# Patient Record
Sex: Male | Born: 1950
Health system: Southern US, Community
[De-identification: ages and names within clinical notes are randomized; demographics above are authoritative.]

## PROBLEM LIST (undated history)

## (undated) DIAGNOSIS — E785 Hyperlipidemia, unspecified: Secondary | ICD-10-CM

## (undated) HISTORY — PX: HEMORRHOID SURGERY: SHX153

## (undated) HISTORY — DX: Hyperlipidemia, unspecified: E78.5

---

## 2012-02-05 ENCOUNTER — Ambulatory Visit (INDEPENDENT_AMBULATORY_CARE_PROVIDER_SITE_OTHER): Payer: BC Managed Care – PPO | Admitting: Sports Medicine

## 2012-02-05 ENCOUNTER — Encounter: Payer: Self-pay | Admitting: Sports Medicine

## 2012-02-05 VITALS — BP 118/81 | HR 74 | Wt 211.0 lb

## 2012-02-05 DIAGNOSIS — E785 Hyperlipidemia, unspecified: Secondary | ICD-10-CM

## 2012-02-05 DIAGNOSIS — M25512 Pain in left shoulder: Secondary | ICD-10-CM | POA: Insufficient documentation

## 2012-02-05 DIAGNOSIS — Z299 Encounter for prophylactic measures, unspecified: Secondary | ICD-10-CM

## 2012-02-05 DIAGNOSIS — Z0000 Encounter for general adult medical examination without abnormal findings: Secondary | ICD-10-CM | POA: Insufficient documentation

## 2012-02-05 DIAGNOSIS — M25519 Pain in unspecified shoulder: Secondary | ICD-10-CM

## 2012-02-05 DIAGNOSIS — M24873 Other specific joint derangements of unspecified ankle, not elsewhere classified: Secondary | ICD-10-CM

## 2012-02-05 DIAGNOSIS — M25371 Other instability, right ankle: Secondary | ICD-10-CM | POA: Insufficient documentation

## 2012-02-05 MED ORDER — MELOXICAM 15 MG PO TABS: ORAL_TABLET | ORAL | Status: DC

## 2012-02-05 NOTE — Progress Notes (Signed)
Subjective:    CC: Establish care.   HPI:  Left shoulder pain: Present for one year, localized on the anterior and lateral mid humerus. Worse with abduction and external rotation, not so bad with reaching overhead. He does wake him from sleep. He has not tried any specific type of treatment. He has not had this evaluated yet. The pain is localized, does not radiate, and is dull.  Right ankle weakness: Notes the he tends to roll the ankle often while playing golf. He thinks the week. He does not have any pain. Denies any numbness or tingling, or pain in his back.  Preventive care: Needs a flu shot, would like cholesterol checked, thinks he is up-to-date on his Tdap.  Past medical history, Surgical history, Family history, Social history, Allergies, and medications have been entered into the medical record, reviewed, and no changes needed.   Review of Systems: No headache, visual changes, nausea, vomiting, diarrhea, constipation, dizziness, abdominal pain, skin rash, fevers, chills, night sweats, swollen lymph nodes, weight loss, chest pain, body aches, joint swelling, muscle aches, or shortness of breath.   Objective:    General: Well Developed, well nourished, and in no acute distress.  Neuro: Alert and oriented x3, extra-ocular muscles intact.  HEENT: Normocephalic, atraumatic, pupils equal round reactive to light, neck supple, no masses, no lymphadenopathy, thyroid nonpalpable.  Skin: Warm and dry, no rashes noted.  Cardiac: Regular rate and rhythm, no murmurs rubs or gallops.  Respiratory: Clear to auscultation bilaterally. Not using accessory muscles, speaking in full sentences.  Abdominal: Soft, nontender, nondistended, positive bowel sounds, no masses, no organomegaly.  Left Shoulder: Inspection reveals no abnormalities, atrophy or asymmetry. Palpation is normal with no tenderness over AC joint or bicipital groove. ROM is full in all planes. Rotator cuff strength is weak to internal  rotation with a positive liftoff test. Positive Neer's, positive Hawkins, negative empty can sign. He does have some weakness with resisted abduction. Speeds and Yergason's tests normal. No labral pathology noted with negative Obrien's, negative clunk and good stability. Normal scapular function observed. No painful arc and no drop arm sign. No apprehension sign  Right Ankle: No visible erythema or swelling. Range of motion is full in all directions. Strength is 5/5 in all directions. Stable lateral and medial ligaments; squeeze test and kleiger test unremarkable; Talar dome nontender; No pain at base of 5th MT; No tenderness over cuboid; No tenderness over N spot or navicular prominence No tenderness on posterior aspects of lateral and medial malleolus No sign of peroneal tendon subluxations or tenderness to palpation Negative tarsal tunnel tinel's Able to walk 4 steps.  Impression and Recommendations:    The patient was counselled, risk factors were discussed, anticipatory guidance given.

## 2012-02-05 NOTE — Assessment & Plan Note (Signed)
Exam is unremarkable. Strength is excellent, and think his symptoms are mostly due to proprioceptive insufficiency. We'll have him work on some home rehabilitation exercises. He can see me back on an as needed basis for this.

## 2012-02-05 NOTE — Patient Instructions (Signed)
Exercise prescription:  You should adjust the intensity of your exercise based on your heart rate. The [employer] sports medicine recommends keeping your heart rate between 70-80% of its maximum for 30 minutes, 3-5 times per week. Maximum heart rate = (220 - age). Multiply this number by 0.75 to get your goal heart rate. If lower, then increase the intensity of your exercise. If the number is higher, you may decrease the intensity of your exercise.  Initial Hamstring Rehab Protocol Hamstring curls: Start with 3 sets of 15 (no weight); Progress by 5 reps every 3 days until you reach 3 sets of 30; After 3 days at 3 sets of 30, add 2lb ankle weight at 3 sets of 10; Increase every 5 days by 5 reps. You may add 2lbs ankle weight once weekly. Hamstring swings- swing leg backwards and curl at the end of the swing. Follow same schedule as above. Hamstring running lunges- running lunge position means no more than 45 degrees of knee flexion and running motion. Follow same schedule as above.

## 2012-02-05 NOTE — Assessment & Plan Note (Signed)
Symptoms are suggestive of rotator cuff impingement syndrome. Mobic, rehabilitation exercises, return to see me in 4-6 weeks. Injection if no better.

## 2012-02-05 NOTE — Assessment & Plan Note (Addendum)
Flu shot. Colonoscopy is up-to-date, and was done in 2013 We'll give Tdap at his complete physical appointment. CBC, CMET, lipids, testosterone. He will come back for blood work when he is fasting.

## 2012-02-09 LAB — COMPREHENSIVE METABOLIC PANEL WITH GFR
ALT: 26 U/L (ref 0–53)
Albumin: 4.3 g/dL (ref 3.5–5.2)
Alkaline Phosphatase: 58 U/L (ref 39–117)
Glucose, Bld: 94 mg/dL (ref 70–99)
Potassium: 4.3 meq/L (ref 3.5–5.3)
Sodium: 138 meq/L (ref 135–145)
Total Bilirubin: 0.4 mg/dL (ref 0.3–1.2)
Total Protein: 7 g/dL (ref 6.0–8.3)

## 2012-02-09 LAB — LIPID PANEL
Cholesterol: 240 mg/dL — ABNORMAL HIGH (ref 0–200)
HDL: 37 mg/dL — ABNORMAL LOW (ref >39)
LDL Cholesterol: 183 mg/dL — ABNORMAL HIGH (ref 0–99)
Total CHOL/HDL Ratio: 6.5 Ratio
Triglycerides: 99 mg/dL (ref <150)
VLDL: 20 mg/dL (ref 0–40)

## 2012-02-09 LAB — COMPREHENSIVE METABOLIC PANEL
AST: 23 U/L (ref 0–37)
BUN: 18 mg/dL (ref 6–23)
CO2: 24 mEq/L (ref 19–32)
Calcium: 9 mg/dL (ref 8.4–10.5)
Chloride: 105 mEq/L (ref 96–112)
Creat: 1.08 mg/dL (ref 0.50–1.35)

## 2012-02-09 LAB — CBC
HCT: 43.3 % (ref 39.0–52.0)
Hemoglobin: 15.1 g/dL (ref 13.0–17.0)
MCH: 34.3 pg — ABNORMAL HIGH (ref 26.0–34.0)
MCHC: 34.9 g/dL (ref 30.0–36.0)
MCV: 98.4 fL (ref 78.0–100.0)
Platelets: 320 K/uL (ref 150–400)
RBC: 4.4 MIL/uL (ref 4.22–5.81)
RDW: 14.9 % (ref 11.5–15.5)
WBC: 5.2 10*3/uL (ref 4.0–10.5)

## 2012-02-10 DIAGNOSIS — E785 Hyperlipidemia, unspecified: Secondary | ICD-10-CM | POA: Insufficient documentation

## 2012-02-10 MED ORDER — ASPIRIN EC 81 MG PO TBEC: 81.0000 mg | DELAYED_RELEASE_TABLET | Freq: Every day | ORAL | Status: DC

## 2012-02-10 MED ORDER — ATORVASTATIN CALCIUM 40 MG PO TABS: 40.0000 mg | ORAL_TABLET | Freq: Every day | ORAL | Status: DC

## 2012-02-10 NOTE — Addendum Note (Signed)
Addended by: Monica Becton on: 02/10/2012 10:32 AM   Modules accepted: Orders

## 2012-02-12 LAB — TESTOSTERONE, FREE, TOTAL, SHBG
Sex Hormone Binding: 32 nmol/L (ref 13–71)
Testosterone, Free: 89.7 pg/mL (ref 47.0–244.0)
Testosterone-% Free: 2.1 % (ref 1.6–2.9)
Testosterone: 429.58 ng/dL (ref 300–890)

## 2012-02-12 NOTE — Assessment & Plan Note (Signed)
Patient desires to start diet and exercise before proceeding to pharmacologic intervention. I think this is appropriate. We will do this for 6 weeks, and if no better will start Lipitor 40.

## 2012-03-04 ENCOUNTER — Encounter: Payer: Self-pay | Admitting: Sports Medicine

## 2012-03-04 ENCOUNTER — Ambulatory Visit (INDEPENDENT_AMBULATORY_CARE_PROVIDER_SITE_OTHER): Payer: BC Managed Care – PPO | Admitting: Sports Medicine

## 2012-03-04 VITALS — BP 102/63 | HR 82 | Ht 71.0 in | Wt 207.0 lb

## 2012-03-04 DIAGNOSIS — Z23 Encounter for immunization: Secondary | ICD-10-CM

## 2012-03-04 DIAGNOSIS — M25519 Pain in unspecified shoulder: Secondary | ICD-10-CM

## 2012-03-04 DIAGNOSIS — M25512 Pain in left shoulder: Secondary | ICD-10-CM

## 2012-03-04 DIAGNOSIS — M25371 Other instability, right ankle: Secondary | ICD-10-CM

## 2012-03-04 DIAGNOSIS — Z299 Encounter for prophylactic measures, unspecified: Secondary | ICD-10-CM

## 2012-03-04 DIAGNOSIS — E785 Hyperlipidemia, unspecified: Secondary | ICD-10-CM

## 2012-03-04 DIAGNOSIS — M24873 Other specific joint derangements of unspecified ankle, not elsewhere classified: Secondary | ICD-10-CM

## 2012-03-04 NOTE — Assessment & Plan Note (Addendum)
Up-to-date on Tdap. We will give him a flu shot today. We would also like to check his PSA in February when he comes for a complete physical

## 2012-03-04 NOTE — Assessment & Plan Note (Signed)
Improving with home rehabilitation exercises.

## 2012-03-04 NOTE — Progress Notes (Signed)
Subjective:    CC: Followup  HPI: Left shoulder pain: Worse with overhead motions, localized over the deltoid, no radiation, moderate. Has been doing rotator cuff rehabilitation exercises, as 50% better and not plateaued. Mobic seems to be working well and he does have refills.  Right ankle instability: Strength is improving significantly with home rehabilitation exercises.  Hyperlipidemia: Desires to try diet and lifestyle modification first, and declines pharmacologic intervention now.  Past medical history, Surgical history, Family history, Social history, Allergies, and medications have been entered into the medical record, reviewed, and no changes needed.   Review of Systems: No fevers, chills, night sweats, weight loss, chest pain, or shortness of breath.   Objective:    General: Well Developed, well nourished, and in no acute distress.  Neuro: Alert and oriented x3, extra-ocular muscles intact.  HEENT: Normocephalic, atraumatic, pupils equal round reactive to light, neck supple, no masses, no lymphadenopathy, thyroid nonpalpable.  Skin: Warm and dry, no rashes. Cardiac: Regular rate and rhythm, no murmurs rubs or gallops.  Respiratory: Clear to auscultation bilaterally. Not using accessory muscles, speaking in full sentences.   Impression and Recommendations:

## 2012-03-04 NOTE — Patient Instructions (Signed)

## 2012-03-04 NOTE — Assessment & Plan Note (Signed)
50% improved with home rehabilitation exercises. Avoid injection today, as he is not plateau. We'll continue rehabilitation exercises, and if he does plateau and symptoms are still persistent we can consider subacromial injection.

## 2012-03-04 NOTE — Assessment & Plan Note (Addendum)
We'll work on diet and exercise for 3 months. I've given him advice on an exercise prescription, as well as diet changes. We will hold off on Lipitor, for 3 months. We will recheck his lipids in 3 months.

## 2012-05-02 LAB — LIPID PANEL
Cholesterol: 222 mg/dL — ABNORMAL HIGH (ref 0–200)
HDL: 39 mg/dL — ABNORMAL LOW (ref >39)
LDL Cholesterol: 162 mg/dL — ABNORMAL HIGH (ref 0–99)
Total CHOL/HDL Ratio: 5.7 Ratio
Triglycerides: 105 mg/dL (ref <150)
VLDL: 21 mg/dL (ref 0–40)

## 2012-05-07 ENCOUNTER — Encounter: Payer: Self-pay | Admitting: Sports Medicine

## 2012-05-07 ENCOUNTER — Ambulatory Visit (INDEPENDENT_AMBULATORY_CARE_PROVIDER_SITE_OTHER): Payer: BC Managed Care – PPO | Admitting: Sports Medicine

## 2012-05-07 VITALS — BP 113/80 | HR 73 | Wt 204.0 lb

## 2012-05-07 DIAGNOSIS — Z299 Encounter for prophylactic measures, unspecified: Secondary | ICD-10-CM

## 2012-05-07 DIAGNOSIS — E785 Hyperlipidemia, unspecified: Secondary | ICD-10-CM

## 2012-05-07 DIAGNOSIS — Z1211 Encounter for screening for malignant neoplasm of colon: Secondary | ICD-10-CM

## 2012-05-07 DIAGNOSIS — M25371 Other instability, right ankle: Secondary | ICD-10-CM

## 2012-05-07 DIAGNOSIS — Z0000 Encounter for general adult medical examination without abnormal findings: Secondary | ICD-10-CM

## 2012-05-07 DIAGNOSIS — M25512 Pain in left shoulder: Secondary | ICD-10-CM

## 2012-05-07 LAB — HEMOCCULT GUIAC POC 1CARD (OFFICE): Fecal Occult Blood, POC: NEGATIVE

## 2012-05-07 NOTE — Assessment & Plan Note (Signed)
I do suspect he likely has some DJD. At this point he is continuing to improve with home rehabilitation exercises and I would like him to continue this before we proceed to further intervention.

## 2012-05-07 NOTE — Assessment & Plan Note (Signed)
Discussed in depth the risks and benefits of treating. Patient desires not to treat this, we will revisit this a later date.

## 2012-05-07 NOTE — Assessment & Plan Note (Signed)
Complete physical exam performed today.

## 2012-05-07 NOTE — Progress Notes (Signed)
Subjective:    CC: Complete physical  HPI:  Adam Hatfield presents for complete physical exam.  Right ankle pain: Has been doing home rehabilitation exercises and is continuing to improve on a daily basis. At this point he does not desire further workup or treatment.  Hyperlipidemia: Does not desire pharmacologic intervention.  Left shoulder pain: Likely rotator cuff dysfunction, he has improved significantly with conservative measures including rotator cuff specific rehabilitation. No further intervention.  Past medical history, Surgical history, Family history not pertinant except as noted below, Social history, Allergies, and medications have been entered into the medical record, reviewed, and no changes needed.   Review of Systems: No headache, visual changes, nausea, vomiting, diarrhea, constipation, dizziness, abdominal pain, skin rash, fevers, chills, night sweats, swollen lymph nodes, weight loss, chest pain, body aches, joint swelling, muscle aches, shortness of breath, mood changes, visual or auditory hallucinations.  Objective:    General Appearance: Alert, well developed and well nourished, cooperative, no distress.  Head: Normocephalic, no obvious abnormality  Eyes: PERRL, EOM's intact, conjunctiva and corneas clear.  Nose: Nares symmetrical, septum midline, mucosa pink, clear watery discharge; no sinus tenderness  Throat: Lips, tongue, and mucosa are moist, pink, and intact; teeth intact  Neck: Supple, symmetrical, trachea midline, no adenopathy; thyroid: no enlargement, symmetric,no tenderness/mass/nodules; no carotid bruit, no JVD  Back: Symmetrical, no curvature, ROM normal, no CVA tenderness  Chest/Breast: No mass or tenderness  Lungs: Clear to auscultation bilaterally, respirations unlabored  Heart: Normal PMI, no lower extremity edema, regular rate & rhythm, S1 and S2 normal, no murmurs, rubs, or gallops. Abdomen: Soft, non-tender, bowel sounds active all four quadrants,  no mass, or organomegaly  Right Ankle: No visible erythema or swelling. Range of motion is full in all directions. Strength is 5/5 in all directions. Stable lateral and medial ligaments; squeeze test and kleiger test unremarkable; Talar dome nontender; No pain at base of 5th MT; No tenderness over cuboid; No tenderness over N spot or navicular prominence No tenderness on posterior aspects of lateral and medial malleolus No sign of peroneal tendon subluxations or tenderness to palpation Negative tarsal tunnel tinel's Able to walk 4 steps. Lymphatic: No adenopathy  Skin/Hair/Nails: Skin warm, dry, and intact, no rashes or abnormal dyspigmentation  Neurologic: Alert and oriented x3, no cranial nerve deficits, normal strength and tone, gait steady  Occult blood test negative. Prostate exam unremarkable.  Impression and Recommendations:   The patient was counselled, risk factors were discussed, anticipatory guidance given.

## 2012-05-07 NOTE — Assessment & Plan Note (Signed)
Doing okay.

## 2013-02-06 ENCOUNTER — Other Ambulatory Visit: Payer: Self-pay

## 2013-02-17 ENCOUNTER — Encounter: Payer: Self-pay | Admitting: Sports Medicine

## 2013-02-17 ENCOUNTER — Ambulatory Visit (INDEPENDENT_AMBULATORY_CARE_PROVIDER_SITE_OTHER): Payer: BC Managed Care – PPO | Admitting: Sports Medicine

## 2013-02-17 VITALS — BP 127/84 | HR 78 | Wt 204.0 lb

## 2013-02-17 DIAGNOSIS — S838X9A Sprain of other specified parts of unspecified knee, initial encounter: Secondary | ICD-10-CM

## 2013-02-17 DIAGNOSIS — Z299 Encounter for prophylactic measures, unspecified: Secondary | ICD-10-CM

## 2013-02-17 DIAGNOSIS — S86111A Strain of other muscle(s) and tendon(s) of posterior muscle group at lower leg level, right leg, initial encounter: Secondary | ICD-10-CM | POA: Insufficient documentation

## 2013-02-17 DIAGNOSIS — Z23 Encounter for immunization: Secondary | ICD-10-CM

## 2013-02-17 NOTE — Progress Notes (Signed)
  Subjective:    CC: Pain on my calf  HPI: Adam Hatfield is a pleasant 62 year old gentleman who is seen today in clinic with a complaint of a 2 day history of pain over his calf muscle. He reports that he was playing golf on Saturday when he felt a pain on his calf after jumping in the air. He states that the pain was a 9 out of 10. He followed the PRICE protocol of pressure, rest, ice, compression and elevation. He states that the pain is now a 2 out of 10. He just wanted to come in today and have it checked out because he was worried he had torn his achilles tendon. Moderate, persistent, no radiation.  Past medical history, Surgical history, Family history not pertinant except as noted below, Social history, Allergies, and medications have been entered into the medical record, reviewed, and no changes needed.   Review of Systems: No fevers, chills, night sweats, weight loss, chest pain, or shortness of breath.   Objective:    General: Well Developed, well nourished, and in no acute distress.  Neuro: Alert and oriented x3, extra-ocular muscles intact, sensation grossly intact.  HEENT: Normocephalic, atraumatic, pupils equal round reactive to light, neck supple, no masses, no lymphadenopathy, thyroid nonpalpable.  Skin: Warm and dry, no rashes. Cardiac: Regular rate and rhythm, no murmurs rubs or gallops, no lower extremity edema.  Respiratory: Clear to auscultation bilaterally. Not using accessory muscles, speaking in full sentences. Achilles: Negative thompson test. No pain over achilles tendon. No swelling or erythema.  Calf: Mild pain at the muscluotendinous junction of the right Gastrocnemius muscle  Impression and Recommendations:    Assessment and Plan Symptoms are consistent with calf strain that is improving -Patient was educated on doing calf exercise on both legs. Patient was given handout on calf exercises as well. Health Maintenance: Patient will report back for routine lab work  in anticipation for annual physical in January.

## 2013-02-17 NOTE — Assessment & Plan Note (Signed)
Patient desires checking blood type. Ordered. I'm also going to add some more routine blood work.

## 2013-04-16 LAB — CBC
HCT: 43.5 % (ref 39.0–52.0)
Hemoglobin: 15 g/dL (ref 13.0–17.0)
MCH: 33.9 pg (ref 26.0–34.0)
MCHC: 34.5 g/dL (ref 30.0–36.0)
MCV: 98.4 fL (ref 78.0–100.0)
Platelets: 302 10*3/uL (ref 150–400)
RBC: 4.42 MIL/uL (ref 4.22–5.81)
RDW: 15 % (ref 11.5–15.5)
WBC: 4.5 10*3/uL (ref 4.0–10.5)

## 2013-04-16 LAB — COMPREHENSIVE METABOLIC PANEL
ALT: 21 U/L (ref 0–53)
AST: 16 U/L (ref 0–37)
Albumin: 4.2 g/dL (ref 3.5–5.2)
Alkaline Phosphatase: 56 U/L (ref 39–117)
BUN: 14 mg/dL (ref 6–23)
CO2: 24 mEq/L (ref 19–32)
Calcium: 9 mg/dL (ref 8.4–10.5)
Chloride: 105 mEq/L (ref 96–112)
Creat: 1.01 mg/dL (ref 0.50–1.35)
Glucose, Bld: 98 mg/dL (ref 70–99)
Potassium: 3.9 mEq/L (ref 3.5–5.3)
Sodium: 136 mEq/L (ref 135–145)
Total Bilirubin: 0.5 mg/dL (ref 0.3–1.2)
Total Protein: 6.6 g/dL (ref 6.0–8.3)

## 2013-04-16 LAB — HEMOGLOBIN A1C
Hgb A1c MFr Bld: 6.2 % — ABNORMAL HIGH (ref <5.7)
Mean Plasma Glucose: 131 mg/dL — ABNORMAL HIGH (ref <117)

## 2013-04-16 LAB — LIPID PANEL
Cholesterol: 209 mg/dL — ABNORMAL HIGH (ref 0–200)
HDL: 38 mg/dL — ABNORMAL LOW (ref >39)
LDL Cholesterol: 147 mg/dL — ABNORMAL HIGH (ref 0–99)
Total CHOL/HDL Ratio: 5.5 Ratio
Triglycerides: 121 mg/dL (ref <150)
VLDL: 24 mg/dL (ref 0–40)

## 2013-04-17 LAB — ABO AND RH: Rh Type: POSITIVE

## 2013-05-08 ENCOUNTER — Ambulatory Visit (INDEPENDENT_AMBULATORY_CARE_PROVIDER_SITE_OTHER): Payer: BC Managed Care – PPO | Admitting: Sports Medicine

## 2013-05-08 ENCOUNTER — Encounter: Payer: Self-pay | Admitting: Sports Medicine

## 2013-05-08 VITALS — BP 135/84 | HR 75 | Ht 71.0 in | Wt 197.0 lb

## 2013-05-08 DIAGNOSIS — Z0000 Encounter for general adult medical examination without abnormal findings: Secondary | ICD-10-CM

## 2013-05-08 DIAGNOSIS — E785 Hyperlipidemia, unspecified: Secondary | ICD-10-CM

## 2013-05-08 DIAGNOSIS — Z299 Encounter for prophylactic measures, unspecified: Secondary | ICD-10-CM

## 2013-05-08 NOTE — Progress Notes (Signed)
  Subjective:    CC: Complete physical exam   HPI:  Hyperlipidemia: Continues to desire to proceed with diet modification. Last lipids were checked one month ago.  Preventive measure: Physical exam performed today.  Past medical history, Surgical history, Family history not pertinant except as noted below, Social history, Allergies, and medications have been entered into the medical record, reviewed, and no changes needed.   Review of Systems: No headache, visual changes, nausea, vomiting, diarrhea, constipation, dizziness, abdominal pain, skin rash, fevers, chills, night sweats, swollen lymph nodes, weight loss, chest pain, body aches, joint swelling, muscle aches, shortness of breath, mood changes, visual or auditory hallucinations.  Objective:    General: Well Developed, well nourished, and in no acute distress.  Neuro: Alert and oriented x3, extra-ocular muscles intact, sensation grossly intact.  HEENT: Normocephalic, atraumatic, pupils equal round reactive to light, neck supple, no masses, no lymphadenopathy, thyroid nonpalpable.  Skin: Warm and dry, no rashes noted.  Cardiac: Regular rate and rhythm, no murmurs rubs or gallops.  Respiratory: Clear to auscultation bilaterally. Not using accessory muscles, speaking in full sentences.  Abdominal: Soft, nontender, nondistended, positive bowel sounds, no masses, no organomegaly.  Musculoskeletal: Shoulder, elbow, wrist, hip, knee, ankle stable, and with full range of motion. Rectal: Good tone, smooth prostate, Hemoccult negative.  Impression and Recommendations:    The patient was counselled, risk factors were discussed, anticipatory guidance given.

## 2013-05-08 NOTE — Assessment & Plan Note (Signed)
Complete physical performed today. We did have a discussion regarding prostate cancer screening. Think about it and let us know.

## 2013-05-08 NOTE — Assessment & Plan Note (Signed)
Still not yet ready to try lipid-lowering medication. Continue lifestyle modification. Recheck lipids in about 2 months.

## 2013-08-22 LAB — LIPID PANEL
Cholesterol: 214 mg/dL — ABNORMAL HIGH (ref 0–200)
HDL: 40 mg/dL (ref >39)
LDL Cholesterol: 155 mg/dL — ABNORMAL HIGH (ref 0–99)
Total CHOL/HDL Ratio: 5.4 ratio
Triglycerides: 97 mg/dL (ref <150)
VLDL: 19 mg/dL (ref 0–40)

## 2014-05-11 ENCOUNTER — Encounter: Payer: BC Managed Care – PPO | Admitting: Sports Medicine

## 2014-06-23 ENCOUNTER — Telehealth: Payer: Self-pay | Admitting: Sports Medicine

## 2014-06-23 DIAGNOSIS — E785 Hyperlipidemia, unspecified: Secondary | ICD-10-CM

## 2014-06-23 NOTE — Telephone Encounter (Signed)
Dr. T please see note below. Tayton Decaire,CMA  

## 2014-06-23 NOTE — Telephone Encounter (Signed)
Patient called advsied that he has a cpe on 07/01/14 and would like to have lab work done before his appt. Patient stated he wanted to go Thursday the latest Monday to have labs done so he can go over results during his appt next Wednesday. If any concerns regarding getting lab work done a head of time pt stated to please call him. Thanks

## 2014-06-24 NOTE — Telephone Encounter (Signed)
Orders placed.

## 2014-06-24 NOTE — Telephone Encounter (Signed)
Patient has been informed. Rhonda Cunningham,CMA  

## 2014-06-30 LAB — COMPREHENSIVE METABOLIC PANEL
AST: 18 U/L (ref 0–37)
Alkaline Phosphatase: 56 U/L (ref 39–117)
BUN: 11 mg/dL (ref 6–23)
Calcium: 9.4 mg/dL (ref 8.4–10.5)
Chloride: 103 mEq/L (ref 96–112)
Potassium: 4.6 mEq/L (ref 3.5–5.3)

## 2014-06-30 LAB — CBC
HCT: 46.5 % (ref 39.0–52.0)
Hemoglobin: 15.4 g/dL (ref 13.0–17.0)
MCH: 33.5 pg (ref 26.0–34.0)
MCHC: 33.1 g/dL (ref 30.0–36.0)
MCV: 101.1 fL — ABNORMAL HIGH (ref 78.0–100.0)
MPV: 10 fL (ref 8.6–12.4)
Platelets: 302 K/uL (ref 150–400)
RBC: 4.6 MIL/uL (ref 4.22–5.81)
RDW: 15 % (ref 11.5–15.5)
WBC: 3.9 K/uL — ABNORMAL LOW (ref 4.0–10.5)

## 2014-06-30 LAB — COMPREHENSIVE METABOLIC PANEL WITH GFR
ALT: 20 U/L (ref 0–53)
Albumin: 4.2 g/dL (ref 3.5–5.2)
CO2: 29 meq/L (ref 19–32)
Creat: 1.06 mg/dL (ref 0.50–1.35)
Glucose, Bld: 97 mg/dL (ref 70–99)
Sodium: 139 meq/L (ref 135–145)
Total Bilirubin: 0.5 mg/dL (ref 0.2–1.2)
Total Protein: 6.9 g/dL (ref 6.0–8.3)

## 2014-06-30 LAB — LIPID PANEL
Cholesterol: 200 mg/dL (ref 0–200)
HDL: 41 mg/dL (ref >=40)
LDL Cholesterol: 137 mg/dL — ABNORMAL HIGH (ref 0–99)
Total CHOL/HDL Ratio: 4.9 ratio
Triglycerides: 108 mg/dL (ref <150)
VLDL: 22 mg/dL (ref 0–40)

## 2014-06-30 LAB — HEMOGLOBIN A1C
Hgb A1c MFr Bld: 6.3 % — ABNORMAL HIGH (ref <5.7)
Mean Plasma Glucose: 134 mg/dL — ABNORMAL HIGH (ref <117)

## 2014-06-30 LAB — TSH: TSH: 1.097 u[IU]/mL (ref 0.350–4.500)

## 2014-07-01 ENCOUNTER — Ambulatory Visit (INDEPENDENT_AMBULATORY_CARE_PROVIDER_SITE_OTHER): Payer: BC Managed Care – PPO | Admitting: Sports Medicine

## 2014-07-01 ENCOUNTER — Encounter: Payer: Self-pay | Admitting: Sports Medicine

## 2014-07-01 VITALS — BP 115/77 | HR 87 | Ht 71.5 in | Wt 201.0 lb

## 2014-07-01 DIAGNOSIS — Z0000 Encounter for general adult medical examination without abnormal findings: Secondary | ICD-10-CM

## 2014-07-01 DIAGNOSIS — Z1211 Encounter for screening for malignant neoplasm of colon: Secondary | ICD-10-CM | POA: Diagnosis not present

## 2014-07-01 DIAGNOSIS — Z87891 Personal history of nicotine dependence: Secondary | ICD-10-CM | POA: Insufficient documentation

## 2014-07-01 DIAGNOSIS — R198 Other specified symptoms and signs involving the digestive system and abdomen: Secondary | ICD-10-CM | POA: Diagnosis not present

## 2014-07-01 DIAGNOSIS — R0989 Other specified symptoms and signs involving the circulatory and respiratory systems: Secondary | ICD-10-CM | POA: Insufficient documentation

## 2014-07-01 DIAGNOSIS — R6889 Other general symptoms and signs: Secondary | ICD-10-CM

## 2014-07-01 DIAGNOSIS — E785 Hyperlipidemia, unspecified: Secondary | ICD-10-CM | POA: Diagnosis not present

## 2014-07-01 LAB — HEMOCCULT GUIAC POC 1CARD (OFFICE): Fecal Occult Blood, POC: NEGATIVE

## 2014-07-01 LAB — HM COLONOSCOPY

## 2014-07-01 MED ORDER — ESOMEPRAZOLE MAGNESIUM 40 MG PO CPDR: DELAYED_RELEASE_CAPSULE | ORAL | Status: DC

## 2014-07-01 MED ORDER — LORATADINE 10 MG PO TABS: 10.0000 mg | ORAL_TABLET | Freq: Every day | ORAL | Status: DC

## 2014-07-01 NOTE — Assessment & Plan Note (Signed)
Annual physical exam as above. 

## 2014-07-01 NOTE — Progress Notes (Signed)
  Subjective:    CC: Complete physical exam  HPI:  Hyperlipidemia: Would like to work on dietary changes before considering medication or herbal supplementation.  History of smoking: 30 pack years with recent quitting, due for screening CT.  Throat clearing: Chronic, no cough, thinks it may be due to allergies.  Past medical history, Surgical history, Family history not pertinant except as noted below, Social history, Allergies, and medications have been entered into the medical record, reviewed, and no changes needed.   Review of Systems: No headache, visual changes, nausea, vomiting, diarrhea, constipation, dizziness, abdominal pain, skin rash, fevers, chills, night sweats, swollen lymph nodes, weight loss, chest pain, body aches, joint swelling, muscle aches, shortness of breath, mood changes, visual or auditory hallucinations.  Objective:    General: Well Developed, well nourished, and in no acute distress.  Neuro: Alert and oriented x3, extra-ocular muscles intact, sensation grossly intact. Cranial nerves II through XII are intact, motor, sensory, and coordinative functions are all intact. HEENT: Normocephalic, atraumatic, pupils equal round reactive to light, neck supple, no masses, no lymphadenopathy, thyroid nonpalpable. Oropharynx, nasopharynx, external ear canals are unremarkable. Skin: Warm and dry, no rashes noted.  Cardiac: Regular rate and rhythm, no murmurs rubs or gallops.  Respiratory: Clear to auscultation bilaterally. Not using accessory muscles, speaking in full sentences.  Abdominal: Soft, nontender, nondistended, positive bowel sounds, no masses, no organomegaly.  Musculoskeletal: Shoulder, elbow, wrist, hip, knee, ankle stable, and with full range of motion. Rectal: Good tone, smooth prostate, Hemoccult negative.  Impression and Recommendations:    The patient was counselled, risk factors were discussed, anticipatory guidance given.

## 2014-07-01 NOTE — Assessment & Plan Note (Signed)
Discussed exercise prescription, dietary changes. We will do this for 3 months, and if still elevated we will add red yeast rice extract.

## 2014-07-01 NOTE — Assessment & Plan Note (Signed)
Start Nexium and loratadine, for question allergies versus laryngeal pharyngeal reflux.

## 2014-07-01 NOTE — Assessment & Plan Note (Signed)
30 pack year smoking history with recent quitting, ordering a screening chest CT.

## 2014-07-06 ENCOUNTER — Ambulatory Visit (INDEPENDENT_AMBULATORY_CARE_PROVIDER_SITE_OTHER): Payer: BC Managed Care – PPO

## 2014-07-06 DIAGNOSIS — Z87891 Personal history of nicotine dependence: Secondary | ICD-10-CM | POA: Diagnosis not present

## 2014-07-06 DIAGNOSIS — J438 Other emphysema: Secondary | ICD-10-CM | POA: Diagnosis not present

## 2014-07-14 ENCOUNTER — Ambulatory Visit: Payer: BC Managed Care – PPO | Admitting: Sports Medicine

## 2014-07-27 ENCOUNTER — Encounter: Payer: Self-pay | Admitting: Sports Medicine

## 2014-07-27 ENCOUNTER — Ambulatory Visit (INDEPENDENT_AMBULATORY_CARE_PROVIDER_SITE_OTHER): Payer: BC Managed Care – PPO | Admitting: Sports Medicine

## 2014-07-27 VITALS — BP 114/75 | HR 82 | Temp 98.0°F | Resp 18 | Ht 71.5 in | Wt 201.0 lb

## 2014-07-27 DIAGNOSIS — R6889 Other general symptoms and signs: Secondary | ICD-10-CM

## 2014-07-27 DIAGNOSIS — I2584 Coronary atherosclerosis due to calcified coronary lesion: Secondary | ICD-10-CM | POA: Diagnosis not present

## 2014-07-27 DIAGNOSIS — R198 Other specified symptoms and signs involving the digestive system and abdomen: Secondary | ICD-10-CM

## 2014-07-27 DIAGNOSIS — E785 Hyperlipidemia, unspecified: Secondary | ICD-10-CM

## 2014-07-27 DIAGNOSIS — I2510 Atherosclerotic heart disease of native coronary artery without angina pectoris: Secondary | ICD-10-CM | POA: Diagnosis not present

## 2014-07-27 DIAGNOSIS — R0989 Other specified symptoms and signs involving the circulatory and respiratory systems: Secondary | ICD-10-CM

## 2014-07-27 NOTE — Assessment & Plan Note (Signed)
LDL was okay however recent chest CT her screening purposes did show evidence of coronary artery calcium given the presumptive diagnosis of coronary artery disease, he has no symptoms. New LDL goal is 70.

## 2014-07-27 NOTE — Assessment & Plan Note (Addendum)
Improved significantly with Nexium. 

## 2014-07-27 NOTE — Progress Notes (Signed)
  Subjective:    CC: Follow-up  HPI: Smoker: Quit some time ago, chest CT showed no evidence of lung cancer but there was mild visible paraseptal emphysema.  Coronary artery disease: Also visible on chest CT. Patient is asymptomatic with an excellent exercise tolerance.  Hyperlipidemia: Does continue to desire to proceed with dietary modifications.  Chronic throat clearing: Improved with discontinuation of coffee and Nexium, this confirms a diagnosis of laryngeal pharyngeal reflux.  Past medical history, Surgical history, Family history not pertinant except as noted below, Social history, Allergies, and medications have been entered into the medical record, reviewed, and no changes needed.   Review of Systems: No fevers, chills, night sweats, weight loss, chest pain, or shortness of breath.   Objective:    General: Well Developed, well nourished, and in no acute distress.  Neuro: Alert and oriented x3, extra-ocular muscles intact, sensation grossly intact.  HEENT: Normocephalic, atraumatic, pupils equal round reactive to light, neck supple, no masses, no lymphadenopathy, thyroid nonpalpable.  Skin: Warm and dry, no rashes. Cardiac: Regular rate and rhythm, no murmurs rubs or gallops, no lower extremity edema.  Respiratory: Clear to auscultation bilaterally. Not using accessory muscles, speaking in full sentences.  Impression and Recommendations:    I spent 25 minutes with this patient, greater than 50% was face-to-face time counseling regarding the above diagnoses as well as going over imaging studies and the readings.

## 2014-09-03 ENCOUNTER — Telehealth: Payer: Self-pay | Admitting: *Deleted

## 2014-09-03 DIAGNOSIS — E785 Hyperlipidemia, unspecified: Secondary | ICD-10-CM

## 2014-09-03 NOTE — Telephone Encounter (Signed)
Pt called again.  He would like to have labs drawn in the morning.  Thank you.

## 2014-09-03 NOTE — Telephone Encounter (Signed)
Pt left vm asking to have the lab order sent to the lab so he can have them done before his appt with you next week.  I am unsure of what labs you want drawn.

## 2014-09-03 NOTE — Telephone Encounter (Signed)
Orders placed.

## 2014-09-04 LAB — COMPREHENSIVE METABOLIC PANEL
Albumin: 4.1 g/dL (ref 3.5–5.2)
Alkaline Phosphatase: 56 U/L (ref 39–117)
Creat: 1.06 mg/dL (ref 0.50–1.35)
Glucose, Bld: 98 mg/dL (ref 70–99)
Sodium: 139 mEq/L (ref 135–145)

## 2014-09-04 LAB — LIPID PANEL
Cholesterol: 194 mg/dL (ref 0–200)
HDL: 43 mg/dL (ref >=40)
LDL Cholesterol: 129 mg/dL — ABNORMAL HIGH (ref 0–99)
Total CHOL/HDL Ratio: 4.5 Ratio
Triglycerides: 110 mg/dL (ref <150)
VLDL: 22 mg/dL (ref 0–40)

## 2014-09-04 LAB — HEMOGLOBIN A1C
Hgb A1c MFr Bld: 6.1 % — ABNORMAL HIGH (ref <5.7)
Mean Plasma Glucose: 128 mg/dL — ABNORMAL HIGH (ref <117)

## 2014-09-04 LAB — COMPREHENSIVE METABOLIC PANEL WITH GFR
ALT: 19 U/L (ref 0–53)
AST: 16 U/L (ref 0–37)
BUN: 12 mg/dL (ref 6–23)
CO2: 25 meq/L (ref 19–32)
Calcium: 9.1 mg/dL (ref 8.4–10.5)
Chloride: 104 meq/L (ref 96–112)
Potassium: 4.6 meq/L (ref 3.5–5.3)
Total Bilirubin: 0.5 mg/dL (ref 0.2–1.2)
Total Protein: 6.8 g/dL (ref 6.0–8.3)

## 2014-09-09 ENCOUNTER — Encounter: Payer: Self-pay | Admitting: Sports Medicine

## 2014-09-09 ENCOUNTER — Ambulatory Visit (INDEPENDENT_AMBULATORY_CARE_PROVIDER_SITE_OTHER): Payer: BC Managed Care – PPO | Admitting: Sports Medicine

## 2014-09-09 VITALS — BP 114/72 | HR 83 | Ht 71.5 in | Wt 196.0 lb

## 2014-09-09 DIAGNOSIS — E785 Hyperlipidemia, unspecified: Secondary | ICD-10-CM | POA: Diagnosis not present

## 2014-09-09 NOTE — Progress Notes (Signed)
  Subjective:    CC: Go over blood work  HPI: Hyperlipidemia: Adam Hatfield had a CT of his chest that did show some coronary calcifications, because of this is new LDL target is 70, he has been working on diet modifications, and has brought his lipids down slightly, he is still resistant to starting a statin medication.  Past medical history, Surgical history, Family history not pertinant except as noted below, Social history, Allergies, and medications have been entered into the medical record, reviewed, and no changes needed.   Review of Systems: No fevers, chills, night sweats, weight loss, chest pain, or shortness of breath.   Objective:    General: Well Developed, well nourished, and in no acute distress.  Neuro: Alert and oriented x3, extra-ocular muscles intact, sensation grossly intact.  HEENT: Normocephalic, atraumatic, pupils equal round reactive to light, neck supple, no masses, no lymphadenopathy, thyroid nonpalpable.  Skin: Warm and dry, no rashes. Cardiac: Regular rate and rhythm, no murmurs rubs or gallops, no lower extremity edema.  Respiratory: Clear to auscultation bilaterally. Not using accessory muscles, speaking in full sentences.  Impression and Recommendations:    I spent 40 minutes with this patient, greater than 50% was face-to-face time counseling regarding the above diagnosis, talking about blood work, and dietary education.

## 2014-09-09 NOTE — Assessment & Plan Note (Signed)
When over blood work. Remi HaggardCornelius will start red rice yeast extract. We can recheck in 3 months. He is resistant to starting a statin.

## 2015-04-23 ENCOUNTER — Telehealth: Payer: Self-pay

## 2015-04-23 DIAGNOSIS — E785 Hyperlipidemia, unspecified: Secondary | ICD-10-CM

## 2015-04-23 NOTE — Telephone Encounter (Signed)
Orders placed.

## 2015-04-23 NOTE — Telephone Encounter (Signed)
Pt has annual exam on 07/02/15 and would like to have blood work completed beforehand. Please advise which labs to order. Thanks.

## 2015-06-14 ENCOUNTER — Telehealth: Payer: Self-pay | Admitting: Sports Medicine

## 2015-06-14 NOTE — Telephone Encounter (Signed)
Patient called to rescheduled his appt due to Dr. being out office on 07/02/15 and asked if he can have PSA and Urinalysis order added to his lab work he is having a cpe on 07/09/15 and there are already lab orders he will go the week of 07/05/15. Thanks

## 2015-06-22 ENCOUNTER — Other Ambulatory Visit: Payer: Self-pay | Admitting: Acute Care

## 2015-06-22 DIAGNOSIS — Z87891 Personal history of nicotine dependence: Secondary | ICD-10-CM

## 2015-07-02 ENCOUNTER — Encounter: Payer: BC Managed Care – PPO | Admitting: Sports Medicine

## 2015-07-05 ENCOUNTER — Other Ambulatory Visit: Payer: Self-pay | Admitting: Sports Medicine

## 2015-07-05 DIAGNOSIS — Z0000 Encounter for general adult medical examination without abnormal findings: Secondary | ICD-10-CM

## 2015-07-06 LAB — LIPID PANEL
Cholesterol: 197 mg/dL (ref 125–200)
HDL: 48 mg/dL (ref >=40)
LDL Cholesterol: 134 mg/dL — ABNORMAL HIGH (ref <130)
Total CHOL/HDL Ratio: 4.1 Ratio (ref <=5.0)
Triglycerides: 76 mg/dL (ref <150)
VLDL: 15 mg/dL (ref <30)

## 2015-07-06 LAB — COMPREHENSIVE METABOLIC PANEL WITH GFR
BUN: 12 mg/dL (ref 7–25)
CO2: 23 mmol/L (ref 20–31)
Chloride: 102 mmol/L (ref 98–110)
Glucose, Bld: 101 mg/dL — ABNORMAL HIGH (ref 65–99)
Sodium: 135 mmol/L (ref 135–146)

## 2015-07-06 LAB — URINALYSIS, ROUTINE W REFLEX MICROSCOPIC
Bilirubin Urine: NEGATIVE
Glucose, UA: NEGATIVE
Hgb urine dipstick: NEGATIVE
Ketones, ur: NEGATIVE
Leukocytes, UA: NEGATIVE
Nitrite: NEGATIVE
Protein, ur: NEGATIVE
Specific Gravity, Urine: 1.011 (ref 1.001–1.035)
pH: 6.5 (ref 5.0–8.0)

## 2015-07-06 LAB — PSA, TOTAL AND FREE
PSA, Free Pct: 39 % (ref >25)
PSA, Free: 0.22 ng/mL
PSA: 0.57 ng/mL (ref <=4.00)

## 2015-07-06 LAB — CBC
HCT: 43.7 % (ref 38.5–50.0)
Hemoglobin: 14.6 g/dL (ref 13.2–17.1)
MCH: 34.3 pg — ABNORMAL HIGH (ref 27.0–33.0)
MCHC: 33.4 g/dL (ref 32.0–36.0)
MCV: 102.6 fL — ABNORMAL HIGH (ref 80.0–100.0)
MPV: 10.5 fL (ref 7.5–12.5)
Platelets: 271 K/uL (ref 140–400)
RBC: 4.26 MIL/uL (ref 4.20–5.80)
RDW: 14.9 % (ref 11.0–15.0)
WBC: 4 10*3/uL (ref 3.8–10.8)

## 2015-07-06 LAB — COMPREHENSIVE METABOLIC PANEL
ALT: 21 U/L (ref 9–46)
AST: 21 U/L (ref 10–35)
Albumin: 4 g/dL (ref 3.6–5.1)
Alkaline Phosphatase: 52 U/L (ref 40–115)
Calcium: 9.1 mg/dL (ref 8.6–10.3)
Creat: 1.11 mg/dL (ref 0.70–1.25)
Potassium: 4.1 mmol/L (ref 3.5–5.3)
Total Bilirubin: 0.5 mg/dL (ref 0.2–1.2)
Total Protein: 6.6 g/dL (ref 6.1–8.1)

## 2015-07-06 LAB — TSH: TSH: 1.34 m[IU]/L (ref 0.40–4.50)

## 2015-07-06 LAB — HEMOGLOBIN A1C
Hgb A1c MFr Bld: 6.1 % — ABNORMAL HIGH (ref <5.7)
Mean Plasma Glucose: 128 mg/dL

## 2015-07-09 ENCOUNTER — Ambulatory Visit (INDEPENDENT_AMBULATORY_CARE_PROVIDER_SITE_OTHER): Payer: BC Managed Care – PPO | Admitting: Sports Medicine

## 2015-07-09 ENCOUNTER — Encounter: Payer: Self-pay | Admitting: Sports Medicine

## 2015-07-09 ENCOUNTER — Telehealth: Payer: Self-pay | Admitting: Acute Care

## 2015-07-09 VITALS — BP 105/73 | HR 69 | Resp 18 | Ht 70.5 in | Wt 192.7 lb

## 2015-07-09 DIAGNOSIS — R252 Cramp and spasm: Secondary | ICD-10-CM

## 2015-07-09 DIAGNOSIS — Z0000 Encounter for general adult medical examination without abnormal findings: Secondary | ICD-10-CM | POA: Diagnosis not present

## 2015-07-09 MED ORDER — MAGNESIUM OXIDE 400 MG PO TABS: 800.0000 mg | ORAL_TABLET | Freq: Every day | ORAL | Status: DC

## 2015-07-09 MED ORDER — CREATINE MONOHYDRATE POWD: Status: DC

## 2015-07-09 NOTE — Telephone Encounter (Signed)
I had called the patient 2 different times in March trying to schedule the 1 year follow up CT for this year. I finally got to speak with him this morning and he stated that he didn't want to do the program anymore. I explained that it was totally up to him whether he continued to do the CT's or not and I would let his doctor know about his decision. I will cancel this order

## 2015-07-09 NOTE — Assessment & Plan Note (Signed)
Complete physical as above. Labs look okay, he will increase his compliant with red yeast rice extract. Not yet ready consider a statin.

## 2015-07-09 NOTE — Assessment & Plan Note (Signed)
Occurs only in the rectus abdominis and the obliques with using the crunch machine, pain is not reproducible, and short-lived. Adding creatine and magnesium.

## 2015-07-09 NOTE — Progress Notes (Signed)
  Subjective:    WU:[id number]:Complete physical  HPI:  Complete physical exam: Up-to-date on screening measures with the exception of hep C, HIV. Zostavax.  Abdominal cramping: Occurs only when doing abdominal isotonic exercises, short-lived. No chest pain. Symptoms are mild, intermittent.   Past medical history, Surgical history, Family history not pertinant except as noted below, Social history, Allergies, and medications have been entered into the medical record, reviewed, and no changes needed.   Review of Systems: No headache, visual changes, nausea, vomiting, diarrhea, constipation, dizziness, abdominal pain, skin rash, fevers, chills, night sweats, swollen lymph nodes, weight loss, chest pain, body aches, joint swelling, muscle aches, shortness of breath, mood changes, visual or auditory hallucinations.  Objective:    General: Well Developed, well nourished, and in no acute distress.  Neuro: Alert and oriented x3, extra-ocular muscles intact, sensation grossly intact. Cranial nerves II through XII are intact, motor, sensory, and coordinative functions are all intact. HEENT: Normocephalic, atraumatic, pupils equal round reactive to light, neck supple, no masses, no lymphadenopathy, thyroid nonpalpable. Oropharynx, nasopharynx, external ear canals are unremarkable. Skin: Warm and dry, no rashes noted.  Cardiac: Regular rate and rhythm, no murmurs rubs or gallops.  Respiratory: Clear to auscultation bilaterally. Not using accessory muscles, speaking in full sentences.  Abdominal: Soft, nontender, nondistended, positive bowel sounds, no masses, no organomegaly.  Musculoskeletal: Shoulder, elbow, wrist, hip, knee, ankle stable, and with full range of motion.  Impression and Recommendations:    The patient was counselled, risk factors were discussed, anticipatory guidance given.

## 2015-11-16 ENCOUNTER — Encounter: Payer: Self-pay | Admitting: Sports Medicine

## 2015-11-16 ENCOUNTER — Ambulatory Visit (INDEPENDENT_AMBULATORY_CARE_PROVIDER_SITE_OTHER): Payer: BC Managed Care – PPO | Admitting: Sports Medicine

## 2015-11-16 VITALS — BP 106/70 | HR 91 | Resp 18 | Wt 196.1 lb

## 2015-11-16 DIAGNOSIS — T148 Other injury of unspecified body region: Secondary | ICD-10-CM

## 2015-11-16 DIAGNOSIS — Z23 Encounter for immunization: Secondary | ICD-10-CM | POA: Diagnosis not present

## 2015-11-16 DIAGNOSIS — Z0000 Encounter for general adult medical examination without abnormal findings: Secondary | ICD-10-CM | POA: Diagnosis not present

## 2015-11-16 DIAGNOSIS — W57XXXA Bitten or stung by nonvenomous insect and other nonvenomous arthropods, initial encounter: Secondary | ICD-10-CM | POA: Diagnosis not present

## 2015-11-16 NOTE — Assessment & Plan Note (Signed)
Zostavax given today.

## 2015-11-16 NOTE — Progress Notes (Signed)
  Subjective:    CC: bug bites  HPI: 65 yo presenting with bug bites on his R ankle and L thigh.  [place] and the next afternoon he realized two small bug bites on his left medial mallelous and one on his upper right thigh.  The bug bites were originally a little itchy but no longer itch.  They do not burn, have not been getting larger, and he does not notice any new ones.  Denies any fevers, chills, recent illness.  Of note, his sister had a similar bite last time she stayed in the cabin.  She was prescribed an antibiotic by her PCP for the bite and he is wondering if he needs antibiotics. No history of tick bite.  Past medical history, Surgical history, Family history not pertinant except as noted below, Social history, Allergies, and medications have been entered into the medical record, reviewed, and no changes needed.   Review of Systems: No fevers, chills, night sweats, weight loss, chest pain, or shortness of breath.   Objective:    General: Well Developed, well nourished, and in no acute distress.  Neuro: Alert and oriented x3, extra-ocular muscles intact, sensation grossly intact.  HEENT: Normocephalic, atraumatic, pupils equal round reactive to light, neck supple, no masses, no lymphadenopathy, thyroid nonpalpable.  Skin: Warm and dry.  Two small, 2mm raised papules on L medial mallelous.  One raised papule on L thigh.    Cardiac: Regular rate and rhythm, no murmurs rubs or gallops, no lower extremity edema.  Respiratory: Clear to auscultation bilaterally. Not using accessory muscles, speaking in full sentences.   Impression and Recommendations:    1. Nonspecific bug bites - no concern for cellulitis or infectious process. No history of tick bite -Return PRN if bites expand.  Provided reassurance.   2. Health maintenance -Will receive shingles vaccine today

## 2015-11-16 NOTE — Assessment & Plan Note (Signed)
No evidence of cellulitis and no history to suggest tick bite. This needs no further intervention.

## 2016-08-08 ENCOUNTER — Telehealth: Payer: Self-pay

## 2016-08-08 DIAGNOSIS — Z0000 Encounter for general adult medical examination without abnormal findings: Secondary | ICD-10-CM

## 2016-08-08 NOTE — Telephone Encounter (Signed)
Pt has an appointment late next week, and wanted to have labs done this week.  What labs do you want prior to his visit.  Please advise.

## 2016-08-08 NOTE — Telephone Encounter (Signed)
Left message on VM for patient regarding labs ordered.

## 2016-08-08 NOTE — Telephone Encounter (Signed)
Sure, orders placed

## 2016-08-12 LAB — CBC
HCT: 45.3 % (ref 38.5–50.0)
Hemoglobin: 14.7 g/dL (ref 13.2–17.1)
MCH: 33.5 pg — ABNORMAL HIGH (ref 27.0–33.0)
MCHC: 32.5 g/dL (ref 32.0–36.0)
MCV: 103.2 fL — ABNORMAL HIGH (ref 80.0–100.0)
MPV: 9.9 fL (ref 7.5–12.5)
Platelets: 311 10*3/uL (ref 140–400)
RBC: 4.39 MIL/uL (ref 4.20–5.80)
RDW: 15 % (ref 11.0–15.0)
WBC: 4.6 10*3/uL (ref 3.8–10.8)

## 2016-08-12 LAB — LIPID PANEL W/REFLEX DIRECT LDL
Cholesterol: 195 mg/dL (ref <200)
HDL: 43 mg/dL (ref >40)
LDL-Cholesterol: 125 mg/dL — ABNORMAL HIGH
Non-HDL Cholesterol (Calc): 152 mg/dL — ABNORMAL HIGH (ref <130)
Total CHOL/HDL Ratio: 4.5 Ratio (ref <5.0)
Triglycerides: 156 mg/dL — ABNORMAL HIGH (ref <150)

## 2016-08-12 LAB — HEMOGLOBIN A1C
Hgb A1c MFr Bld: 5.9 % — ABNORMAL HIGH (ref <5.7)
Mean Plasma Glucose: 123 mg/dL

## 2016-08-12 LAB — COMPREHENSIVE METABOLIC PANEL
BUN: 15 mg/dL (ref 7–25)
CO2: 25 mmol/L (ref 20–31)
Chloride: 105 mmol/L (ref 98–110)
Creat: 1.07 mg/dL (ref 0.70–1.25)
Glucose, Bld: 98 mg/dL (ref 65–99)
Sodium: 139 mmol/L (ref 135–146)

## 2016-08-12 LAB — COMPREHENSIVE METABOLIC PANEL WITH GFR
ALT: 25 U/L (ref 9–46)
AST: 20 U/L (ref 10–35)
Albumin: 4.2 g/dL (ref 3.6–5.1)
Alkaline Phosphatase: 51 U/L (ref 40–115)
Calcium: 8.9 mg/dL (ref 8.6–10.3)
Potassium: 4.5 mmol/L (ref 3.5–5.3)
Total Bilirubin: 0.3 mg/dL (ref 0.2–1.2)
Total Protein: 6.6 g/dL (ref 6.1–8.1)

## 2016-08-12 LAB — VITAMIN D 25 HYDROXY (VIT D DEFICIENCY, FRACTURES): Vit D, 25-Hydroxy: 24 ng/mL — ABNORMAL LOW (ref 30–100)

## 2016-08-14 LAB — PSA, TOTAL AND FREE
PSA, % Free: 50 % (ref >25)
PSA, Free: 0.2 ng/mL
PSA, Total: 0.4 ng/mL (ref <=4.0)

## 2016-08-14 MED ORDER — VITAMIN D (ERGOCALCIFEROL) 1.25 MG (50000 UNIT) PO CAPS: 50000.0000 [IU] | ORAL_CAPSULE | ORAL | 0 refills | Status: DC

## 2016-08-14 NOTE — Addendum Note (Signed)
Addended by: Monica BectonHEKKEKANDAM, THOMAS J on: 08/14/2016 02:58 PM   Modules accepted: Orders

## 2016-08-17 ENCOUNTER — Ambulatory Visit (INDEPENDENT_AMBULATORY_CARE_PROVIDER_SITE_OTHER): Payer: BC Managed Care – PPO | Admitting: Sports Medicine

## 2016-08-17 ENCOUNTER — Encounter: Payer: Self-pay | Admitting: Sports Medicine

## 2016-08-17 DIAGNOSIS — Z0000 Encounter for general adult medical examination without abnormal findings: Secondary | ICD-10-CM | POA: Diagnosis not present

## 2016-08-17 NOTE — Assessment & Plan Note (Signed)
Annual physical as above, patient is healthy, no further intervention needed.

## 2016-08-17 NOTE — Progress Notes (Signed)
  Subjective:    CC: Annual physical  HPI:  This is a pleasant 66 year old male, he is healthy, no complaints, here for his physical.  Past medical history:  Negative.  See flowsheet/record as well for more information.  Surgical history: Negative.  See flowsheet/record as well for more information.  Family history: Negative.  See flowsheet/record as well for more information.  Social history: Negative.  See flowsheet/record as well for more information.  Allergies, and medications have been entered into the medical record, reviewed, and no changes needed.    Review of Systems: No headache, visual changes, nausea, vomiting, diarrhea, constipation, dizziness, abdominal pain, skin rash, fevers, chills, night sweats, swollen lymph nodes, weight loss, chest pain, body aches, joint swelling, muscle aches, shortness of breath, mood changes, visual or auditory hallucinations.  Objective:    General: Well Developed, well nourished, and in no acute distress.  Neuro: Alert and oriented x3, extra-ocular muscles intact, sensation grossly intact. Cranial nerves II through XII are intact, motor, sensory, and coordinative functions are all intact. HEENT: Normocephalic, atraumatic, pupils equal round reactive to light, neck supple, no masses, no lymphadenopathy, thyroid nonpalpable. Oropharynx, nasopharynx, external ear canals are unremarkable. Skin: Warm and dry, no rashes noted.  Cardiac: Regular rate and rhythm, no murmurs rubs or gallops.  Respiratory: Clear to auscultation bilaterally. Not using accessory muscles, speaking in full sentences.  Abdominal: Soft, nontender, nondistended, positive bowel sounds, no masses, no organomegaly.  Musculoskeletal: Shoulder, elbow, wrist, hip, knee, ankle stable, and with full range of motion. Rectal: Good tone, smooth prostate, Hemoccult negative.  Impression and Recommendations:    The patient was counselled, risk factors were discussed, anticipatory guidance  given.  Annual physical exam Annual physical as above, patient is healthy, no further intervention needed.

## 2016-10-25 ENCOUNTER — Encounter: Payer: Self-pay | Admitting: Family Medicine

## 2016-10-25 ENCOUNTER — Ambulatory Visit (INDEPENDENT_AMBULATORY_CARE_PROVIDER_SITE_OTHER): Payer: BC Managed Care – PPO | Admitting: Family Medicine

## 2016-10-25 VITALS — BP 135/80 | HR 77 | Wt 201.0 lb

## 2016-10-25 DIAGNOSIS — S46812A Strain of other muscles, fascia and tendons at shoulder and upper arm level, left arm, initial encounter: Secondary | ICD-10-CM | POA: Diagnosis not present

## 2016-10-25 MED ORDER — CYCLOBENZAPRINE HCL 10 MG PO TABS: 10.0000 mg | ORAL_TABLET | Freq: Three times a day (TID) | ORAL | 0 refills | Status: DC | PRN

## 2016-10-25 NOTE — Progress Notes (Signed)
   Adam Hatfield is a 66 y.o. male who [facility] today for left neck pain. Patient was in his normal state of health yesterday when he developed left lateral neck pain while playing golf. He denies any radiating pain weakness or numbness. Pain is worse with neck motion. Pain is moderate. He has tried a heating pad which helps some. No fevers chills vomiting or diarrhea. No acute injury.   Past Medical History:  Diagnosis Date  . Hyperlipidemia    Past Surgical History:  Procedure Laterality Date  . HEMORRHOID SURGERY  [id number]   Social History  Substance Use Topics  . Smoking status: Former Smoker    Quit date: 04/03/2010  . Smokeless tobacco: Never Used  . Alcohol use Yes     ROS:  As above   Medications: Current Outpatient Prescriptions  Medication Sig Dispense Refill  . aspirin EC 81 MG tablet Take 1 tablet (81 mg total) by mouth daily. 90 tablet 3  . Creatine Monohydrate POWD 20 g by mouth daily for 5 days then 5 g by mouth daily 1 Bottle 11  . magnesium oxide (MAG-OX) 400 MG tablet Take 2 tablets (800 mg total) by mouth at bedtime. 90 tablet 3  . psyllium (METAMUCIL) 58.6 % powder Take 1 packet by mouth as needed.    . cyclobenzaprine (FLEXERIL) 10 MG tablet Take 1 tablet (10 mg total) by mouth 3 (three) times daily as needed for muscle spasms. 30 tablet 0   No current facility-administered medications for this visit.    Allergies  Allergen Reactions  . Amoxicillin Swelling  . Biaxin [Clarithromycin] Swelling     Exam:  BP 135/80   Pulse 77   Wt 201 lb (91.2 kg)   SpO2 100%   BMI 28.43 kg/m  General: Well Developed, well nourished, and in no acute distress.  Neuro/Psych: Alert and oriented x3, extra-ocular muscles intact, able to move all 4 extremities, sensation grossly intact. Skin: Warm and dry, no rashes noted.  Respiratory: Not using accessory muscles, speaking in full sentences, trachea  midline.  Cardiovascular: Pulses palpable, no extremity edema. Abdomen: Does not appear distended. MSK:  C-spine nontender to spinal midline. Tender to palpation along the course of the left trapezius. Neck range of motion is normal with the exception of left lateral flexion and left rotation which is limited by pain. Upper extremity strength sensation and reflexes are equal and normal bilaterally.    No results found for this or any previous visit (from the past 48 hour(s)). No results found.    Assessment and Plan: 66 y.o. male with left trapezius strain. Discussed options. Plan for treatment with heating pad TENS unit Flexeril oral NSAIDs, and referral to physical therapy. Recheck if not improved.    Orders Placed This Encounter  Procedures  . Ambulatory referral to Physical Therapy    Referral Priority:   Routine    Referral Type:   Physical Medicine    Referral Reason:   Specialty Services Required    Requested Specialty:   Physical Therapy   Meds ordered this encounter  Medications  . cyclobenzaprine (FLEXERIL) 10 MG tablet    Sig: Take 1 tablet (10 mg total) by mouth 3 (three) times daily as needed for muscle spasms.    Dispense:  30 tablet    Refill:  0    Discussed warning signs or symptoms. Please see discharge instructions. Patient expresses understanding.

## 2016-10-25 NOTE — Patient Instructions (Signed)
Thank you for coming in today. Use a heating pad.  Use the flexeril muscle relaxer at bedtime as needed.  Attend PT if not better.   Use a TENS unit.    TENS UNIT: This is helpful for muscle pain and spasm.   Search and Purchase a TENS 7000 2nd edition at  www.tenspros.com or www.Amazon.com It should be less than $30.     TENS unit instructions: Do not shower or bathe with the unit on Turn the unit off before removing electrodes or batteries If the electrodes lose stickiness add a drop of water to the electrodes after they are disconnected from the unit and place on plastic sheet. If you continued to have difficulty, call the TENS unit company to purchase more electrodes. Do not apply lotion on the skin area prior to use. Make sure the skin is clean and dry as this will help prolong the life of the electrodes. After use, always check skin for unusual red areas, rash or other skin difficulties. If there are any skin problems, does not apply electrodes to the same area. Never remove the electrodes from the unit by pulling the wires. Do not use the TENS unit or electrodes other than as directed. Do not change electrode placement without consultating your therapist or physician. Keep 2 fingers with between each electrode. Wear time ratio is 2:1, on to off times.    For example on for 30 minutes off for 15 minutes and then on for 30 minutes off for 15 minutes    Cervical Strain and Sprain Rehab Ask your health care provider which exercises are safe for you. Do exercises exactly as told by your health care provider and adjust them as directed. It is normal to feel mild stretching, pulling, tightness, or discomfort as you do these exercises, but you should stop right away if you feel sudden pain or your pain gets worse.Do not begin these exercises until told by your health care provider. Stretching and range of motion exercises These exercises warm up your muscles and joints and improve  the movement and flexibility of your neck. These exercises also help to relieve pain, numbness, and tingling. Exercise A: Cervical side bend  1. Using good posture, sit on a stable chair or stand up. 2. Without moving your shoulders, slowly tilt your left / right ear to your shoulder until you feel a stretch in your neck muscles. You should be looking straight ahead. 3. Hold for __________ seconds. 4. Repeat with the other side of your neck. Repeat __________ times. Complete this exercise __________ times a day. Exercise B: Cervical rotation  1. Using good posture, sit on a stable chair or stand up. 2. Slowly turn your head to the side as if you are looking over your left / right shoulder. ? Keep your eyes level with the ground. ? Stop when you feel a stretch along the side and the back of your neck. 3. Hold for __________ seconds. 4. Repeat this by turning to your other side. Repeat __________ times. Complete this exercise __________ times a day. Exercise C: Thoracic extension and pectoral stretch 1. Roll a towel or a small blanket so it is about 4 inches (10 cm) in diameter. 2. Lie down on your back on a firm surface. 3. Put the towel lengthwise, under your spine in the middle of your back. It should not be not under your shoulder blades. The towel should line up with your spine from your middle back to your  lower back. 4. Put your hands behind your head and let your elbows fall out to your sides. 5. Hold for __________ seconds. Repeat __________ times. Complete this exercise __________ times a day. Strengthening exercises These exercises build strength and endurance in your neck. Endurance is the ability to use your muscles for a long time, even after your muscles get tired. Exercise D: Upper cervical flexion, isometric 1. Lie on your back with a thin pillow behind your head and a small rolled-up towel under your neck. 2. Gently tuck your chin toward your chest and nod your head down  to look toward your feet. Do not lift your head off the pillow. 3. Hold for __________ seconds. 4. Release the tension slowly. Relax your neck muscles completely before you repeat this exercise. Repeat __________ times. Complete this exercise __________ times a day. Exercise E: Cervical extension, isometric  1. Stand about 6 inches (15 cm) away from a wall, with your back facing the wall. 2. Place a soft object, about 6-8 inches (15-20 cm) in diameter, between the back of your head and the wall. A soft object could be a small pillow, a ball, or a folded towel. 3. Gently tilt your head back and press into the soft object. Keep your jaw and forehead relaxed. 4. Hold for __________ seconds. 5. Release the tension slowly. Relax your neck muscles completely before you repeat this exercise. Repeat __________ times. Complete this exercise __________ times a day. Posture and body mechanics  Body mechanics refers to the movements and positions of your body while you do your daily activities. Posture is part of body mechanics. Good posture and healthy body mechanics can help to relieve stress in your body's tissues and joints. Good posture means that your spine is in its natural S-curve position (your spine is neutral), your shoulders are pulled back slightly, and your head is not tipped forward. The following are general guidelines for applying improved posture and body mechanics to your everyday activities. Standing  When standing, keep your spine neutral and keep your feet about hip-width apart. Keep a slight bend in your knees. Your ears, shoulders, and hips should line up.  When you do a task in which you stand in one place for a long time, place one foot up on a stable object that is 2-4 inches (5-10 cm) high, such as a footstool. This helps keep your spine neutral. Sitting   When sitting, keep your spine neutral and your keep feet flat on the floor. Use a footrest, if necessary, and keep your  thighs parallel to the floor. Avoid rounding your shoulders, and avoid tilting your head forward.  When working at a desk or a computer, keep your desk at a height where your hands are slightly lower than your elbows. Slide your chair under your desk so you are close enough to maintain good posture.  When working at a computer, place your monitor at a height where you are looking straight ahead and you do not have to tilt your head forward or downward to look at the screen. Resting When lying down and resting, avoid positions that are most painful for you. Try to support your neck in a neutral position. You can use a contour pillow or a small rolled-up towel. Your pillow should support your neck but not push on it. This information is not intended to replace advice given to you by your health care provider. Make sure you discuss any questions you have with your health  care provider. Document Released: 03/20/2005 Document Revised: 11/25/2015 Document Reviewed: 02/24/2015 Elsevier Interactive Patient Education  [employer]2018 [employer]

## 2016-11-02 ENCOUNTER — Ambulatory Visit (INDEPENDENT_AMBULATORY_CARE_PROVIDER_SITE_OTHER): Payer: BC Managed Care – PPO | Admitting: Rehabilitative and Restorative Service Providers"

## 2016-11-02 DIAGNOSIS — M542 Cervicalgia: Secondary | ICD-10-CM | POA: Diagnosis not present

## 2016-11-02 DIAGNOSIS — R293 Abnormal posture: Secondary | ICD-10-CM

## 2016-11-02 NOTE — Patient Instructions (Addendum)
Axial Extension (Chin Tuck)    Pull chin in and lengthen back of neck. Hold ___10-15_ seconds while counting out loud. Repeat _3-5___ times. Do _several___ sessions per day.   Shoulder Blade Squeeze    Rotate shoulders back, then squeeze shoulder blades down and back. Hold 10 sec Repeat _10___ times. Do __several__ sessions per day. Can use swim noodle to cue    Scapular Retraction: Elbow Flexion (Standing)    With elbows bent to 90, pinch shoulder blades together and rotate arms out, keeping elbows bent. Repeat __10__ times per set. Do __1-2__ sets per session. Do __2-3__ sessions per day.  Resisted External Rotation: in Neutral - Bilateral   PALMS UP Sit or stand, tubing in both hands, elbows at sides, bent to 90, forearms forward. Pinch shoulder blades together and rotate forearms out. Keep elbows at sides. Repeat __10__ times per set. Do _2-3___ sets per session. Do _2-3___ sessions per day.   Low Row: Standing   Face anchor, feet shoulder width apart. Palms up, pull arms back, squeezing shoulder blades together. Repeat 10__ times per set. Do 2-3__ sets per session. Do 2-3__ sessions per day. Anchor Height: Waist   Strengthening: Resisted Extension   Hold tubing in right hand, arm forward. Pull arm back, elbow straight. Repeat _10___ times per set. Do 2-3____ sets per session. Do 2-3____ sessions per day.   Scapula Adduction With Pectoralis Stretch: Low - Standing   Shoulders at 45 hands even with shoulders, keeping weight through legs, shift weight forward until you feel pull or stretch through the front of your chest. Hold _30__ seconds. Do _3__ times, _2-4__ times per day.   Scapula Adduction With Pectoralis Stretch: Mid-Range - Standing   Shoulders at 90 elbows even with shoulders, keeping weight through legs, shift weight forward until you feel pull or strength through the front of your chest. Hold __30_ seconds. Do _3__ times, __2-4_ times per  day.   Scapula Adduction With Pectoralis Stretch: High - Standing   Shoulders at 120 hands up high on the doorway, keeping weight on feet, shift weight forward until you feel pull or stretch through the front of your chest. Hold _30__ seconds. Do _3__ times, _2-3__ times per day.

## 2016-11-02 NOTE — Therapy (Addendum)
[facility] [address] [place], [place], [address] Phone: [phone]   Fax:  [phone]  Physical Therapy Evaluation  Patient Details  Name: Adam Hatfield MRN: [id number] Date of Birth: March 05, 1951 Referring Provider: Dr Georgina Snell   Encounter Date: 11/02/2016      PT End of Session - 11/02/16 1241    Visit Number 1   Number of Visits 6   Date for PT Re-Evaluation 12/14/16   PT Start Time 7893   PT Stop Time 1240   PT Time Calculation (min) 56 min   Activity Tolerance Patient tolerated treatment well      Past Medical History:  Diagnosis Date  . Hyperlipidemia     Past Surgical History:  Procedure Laterality Date  . HEMORRHOID SURGERY  [id number]    There were no vitals filed for this visit.       Subjective Assessment - 11/02/16 1152    Subjective Patient reports that he was plaing golf 9 days ago and felt pain in his neck - Symptoms continued after golf. He was seen by MD and and started exercises which has improved.    Pertinent History back spasms a couple of times a year resolves without treatment - 6-8 years ago    Currently in Pain? Yes   Pain Score 2    Pain Location Neck   Pain Orientation Left   Pain Descriptors / Indicators Tightness   Pain Type Acute pain   Pain Onset 1 to 4 weeks ago   Pain Frequency Intermittent   Aggravating Factors  turning to Lt - some turning to the Rt    Pain Relieving Factors heat; exercises             [facility] PT Assessment - 11/02/16 0001      Assessment   Medical Diagnosis Lt Trapezius strain    Referring Provider Dr Georgina Snell    Onset Date/Surgical Date 11/25/16   Hand Dominance Right   Next MD Visit PRN   Prior Therapy none      Precautions   Precautions None     Balance Screen   Has the patient fallen in the past 6 months No   Has the patient had a decrease in activity level because of a fear of falling?  No   Is the patient reluctant to leave their  home because of a fear of falling?  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    Leisure yard work; gym three times a week; jogging 3 times a week; golf 2 times a week      Observation/Other Assessments   Focus on Therapeutic Outcomes (FOTO)  31% limitation      Sensation   Additional Comments WFL's per pt report      Posture/Postural Control   Posture Comments head forward shoulders rounded and elevated      AROM   Cervical Flexion 40   Cervical Extension 61   Cervical - Right Side Bend 38   Cervical - Left Side Bend 27   Cervical - Right Rotation 70   Cervical - Left Rotation 62     Strength   Overall Strength Comments WFL's some weakness middle and lower trap      Palpation   Palpation comment tightness Lt upper trap; pecs; leveator; teres  Objective measurements completed on examination: See above findings.          [facility] PT Treatment/Exercise - 2016/11/30 0001      Neuro Re-ed    Neuro Re-ed Details  working on posture and alignment focus on opening chest engaging posterior shoulder girdle musculature      Exercises   Exercises --  See exercises in HEP                 PT Education - 11/30/2016 1232    Education provided Yes   Education Details posture and alignment; HEP   Person(s) Educated Patient   Methods Explanation;Demonstration;Tactile cues;Verbal cues;Handout   Comprehension Verbalized understanding;Returned demonstration;Verbal cues required;Tactile cues required             PT Long Term Goals - 11-30-16 1247      PT LONG TERM GOAL #1   Title Improve posture and alignment with patient to demonstrate improved upright posture 12/14/16   Time 6   Period Weeks   Status New     PT LONG TERM GOAL #2   Title Increase Lt cervical rotation and lateral flexion to equal or greater than Rt  12/14/16   Time 6   Period Weeks   Status New     PT LONG TERM GOAL #3   Title Patient reports full pain free cervical ROM 12/14/16   Time 6   Period Weeks   Status New     PT LONG TERM GOAL #4   Title Independent in HEP 12/14/16   Time 6   Period Weeks   Status New     PT LONG TERM GOAL #5   Title Improve FOTO to </= 22% limitation 12/14/16   Time 6   Period Weeks   Status New                Plan - 11/30/2016 1242    Clinical Impression Statement Patient presents with resolving Lt cervical tightness and pain. He has poor posture and alignment; limited cervical ROM; muscular tightness to palpation; continued discomfort with cervical movements.    Clinical Presentation Stable   Clinical Decision Making Low   Rehab Potential Good   PT Frequency 1x / week   PT Duration 6 weeks   PT Treatment/Interventions Patient/family education;ADLs/Self Care Home Management;Neuromuscular re-education;Therapeutic activities;Therapeutic exercise;Cryotherapy;Electrical Stimulation;Iontophoresis '4mg'$ /ml Dexamethasone;Moist Heat;Traction;Ultrasound;Dry needling;Manual techniques   PT Next Visit Plan review HEP; postural correction; progress with postterior shoulder girdle strengthening; add manual work; modalities as indicated. Patient would like to work on HEP and will call if he feels he needs to schedule additional appointments.    Consulted and Agree with Plan of Care Patient      Patient will benefit from skilled therapeutic intervention in order to improve the following deficits and impairments:  Postural dysfunction, Improper body mechanics, Pain, Decreased range of motion, Increased fascial restricitons, Decreased mobility, Decreased activity tolerance  Visit Diagnosis: Cervicalgia - Plan: PT plan of care cert/re-cert  Abnormal posture - Plan: PT plan of care cert/re-cert      [facility] PT PB G-CODES - 2016-11-30 1250    Functional Assessment Tool Used  Evaluation; FOTO; clinical  assessment    Functional Limitations Changing and maintaining body position   Changing and Maintaining Body Position Current Status (K2409) At least 20 percent but less than 40 percent impaired, limited or restricted   Changing and Maintaining Body Position Goal Status (B3532) At least 1 percent but less than 20 percent impaired,  limited or restricted       Problem List Patient Active Problem List   Diagnosis Date Noted  . Bug bite 11/16/2015  . Muscle cramps 07/09/2015  . CAD (coronary artery disease) 07/27/2014  . History of smoking 07/01/2014  . Chronic throat clearing 07/01/2014  . Hyperlipidemia LDL goal <70 02/10/2012  . Annual physical exam 02/05/2012    Keaten Mashek Nilda Simmer PT, MPH  11/02/2016, 12:52 PM  [facility] [address] [place], [place], [address] Phone: [phone]   Fax:  [phone]  Name: Adam Hatfield MRN: [id number] Date of Birth: 1950-12-26  PHYSICAL THERAPY DISCHARGE SUMMARY  Visits from Start of Care: Initial evaluation only  Current functional level related to goals / functional outcomes: Unchanged    Remaining deficits: Unchanged    Education / Equipment: HEP Plan: Patient agrees to discharge.  Patient goals were not met. Patient is being discharged due to not returning since the last visit.  ?????     Jerelyn Trimarco P. Helene Kelp PT, MPH 12/22/16 2:53 PM

## 2017-02-19 ENCOUNTER — Encounter: Payer: Self-pay | Admitting: Sports Medicine

## 2017-02-19 ENCOUNTER — Ambulatory Visit: Payer: BC Managed Care – PPO | Admitting: Sports Medicine

## 2017-02-19 DIAGNOSIS — J069 Acute upper respiratory infection, unspecified: Secondary | ICD-10-CM | POA: Diagnosis not present

## 2017-02-19 NOTE — Assessment & Plan Note (Signed)
Over-the-counter cold and flu medications, hydration. I do not think he is contagious now. Exam is benign. Return as needed.

## 2017-02-19 NOTE — Progress Notes (Signed)
  Subjective:    CC: Sore throat  HPI: This is a pleasant 66 year old male, for the past several days he has had a mild sore throat, mild runny nose and mild cough, no fevers or chills, he did have a flu shot about a week ago, a couple of days later he developed some mild myalgias without any additional symptoms which then resolved.  He denies chest pain, shortness of breath, no GI symptoms.  Past medical history:  Negative.  See flowsheet/record as well for more information.  Surgical history: Negative.  See flowsheet/record as well for more information.  Family history: Negative.  See flowsheet/record as well for more information.  Social history: Negative.  See flowsheet/record as well for more information.  Allergies, and medications have been entered into the medical record, reviewed, and no changes needed.   Review of Systems: No fevers, chills, night sweats, weight loss, chest pain, or shortness of breath.   Objective:    General: Well Developed, well nourished, and in no acute distress.  Neuro: Alert and oriented x3, extra-ocular muscles intact, sensation grossly intact.  HEENT: Normocephalic, atraumatic, pupils equal round reactive to light, neck supple, no masses, no lymphadenopathy, thyroid nonpalpable.  Oropharynx, nasopharynx, ear canals unremarkable. Skin: Warm and dry, no rashes. Cardiac: Regular rate and rhythm, no murmurs rubs or gallops, no lower extremity edema.  Respiratory: Clear to auscultation bilaterally. Not using accessory muscles, speaking in full sentences.  Impression and Recommendations:    Viral URI Over-the-counter cold and flu medications, hydration. I do not think he is contagious now. Exam is benign. Return as needed.  I spent 25 minutes with this patient, greater than 50% was face-to-face time counseling regarding the above diagnoses ___________________________________________ Ihor Austinhomas J. Benjamin Stainhekkekandam, M.D., ABFM., CAQSM. Primary Care and Sports  Medicine [facility]  Adjunct Instructor of Family Medicine  [employer]

## 2017-02-19 NOTE — Patient Instructions (Signed)
Upper Respiratory Infection, Adult Most upper respiratory infections (URIs) are a viral infection of the air passages leading to the lungs. A URI affects the nose, throat, and upper air passages. The most common type of URI is nasopharyngitis and is typically referred to as "the common cold." URIs run their course and usually go away on their own. Most of the time, a URI does not require medical attention, but sometimes a bacterial infection in the upper airways can follow a viral infection. This is called a secondary infection. Sinus and middle ear infections are common types of secondary upper respiratory infections. Bacterial pneumonia can also complicate a URI. A URI can worsen asthma and chronic obstructive pulmonary disease (COPD). Sometimes, these complications can require emergency medical care and may be life threatening. What are the causes? Almost all URIs are caused by viruses. A virus is a type of germ and can spread from one person to another. What increases the risk? You may be at risk for a URI if:  You smoke.  You have chronic heart or lung disease.  You have a weakened defense (immune) system.  You are very young or very old.  You have nasal allergies or asthma.  You work in crowded or poorly ventilated areas.  You work in health care facilities or schools.  What are the signs or symptoms? Symptoms typically develop 2-3 days after you come in contact with a cold virus. Most viral URIs last 7-10 days. However, viral URIs from the influenza virus (flu virus) can last 14-18 days and are typically more severe. Symptoms may include:  Runny or stuffy (congested) nose.  Sneezing.  Cough.  Sore throat.  Headache.  Fatigue.  Fever.  Loss of appetite.  Pain in your forehead, behind your eyes, and over your cheekbones (sinus pain).  Muscle aches.  How is this diagnosed? Your health care provider may diagnose a URI by:  Physical exam.  Tests to check that your  symptoms are not due to another condition such as: ? Strep throat. ? Sinusitis. ? Pneumonia. ? Asthma.  How is this treated? A URI goes away on its own with time. It cannot be cured with medicines, but medicines may be prescribed or recommended to relieve symptoms. Medicines may help:  Reduce your fever.  Reduce your cough.  Relieve nasal congestion.  Follow these instructions at home:  Take medicines only as directed by your health care provider.  Gargle warm saltwater or take cough drops to comfort your throat as directed by your health care provider.  Use a warm mist humidifier or inhale steam from a shower to increase air moisture. This may make it easier to breathe.  Drink enough fluid to keep your urine clear or pale yellow.  Eat soups and other clear broths and maintain good nutrition.  Rest as needed.  Return to work when your temperature has returned to normal or as your health care provider advises. You may need to stay home longer to avoid infecting others. You can also use a face mask and careful hand washing to prevent spread of the virus.  Increase the usage of your inhaler if you have asthma.  Do not use any tobacco products, including cigarettes, chewing tobacco, or electronic cigarettes. If you need help quitting, ask your health care provider. How is this prevented? The best way to protect yourself from getting a cold is to practice good hygiene.  Avoid oral or hand contact with people with cold symptoms.  Wash your   hands often if contact occurs.  There is no clear evidence that vitamin C, vitamin E, echinacea, or exercise reduces the chance of developing a cold. However, it is always recommended to get plenty of rest, exercise, and practice good nutrition. Contact a health care provider if:  You are getting worse rather than better.  Your symptoms are not controlled by medicine.  You have chills.  You have worsening shortness of breath.  You have  brown or red mucus.  You have yellow or brown nasal discharge.  You have pain in your face, especially when you bend forward.  You have a fever.  You have swollen neck glands.  You have pain while swallowing.  You have white areas in the back of your throat. Get help right away if:  You have severe or persistent: ? Headache. ? Ear pain. ? Sinus pain. ? Chest pain.  You have chronic lung disease and any of the following: ? Wheezing. ? Prolonged cough. ? Coughing up blood. ? A change in your usual mucus.  You have a stiff neck.  You have changes in your: ? Vision. ? Hearing. ? Thinking. ? Mood. This information is not intended to replace advice given to you by your health care provider. Make sure you discuss any questions you have with your health care provider. Document Released: 09/13/2000 Document Revised: 11/21/2015 Document Reviewed: 06/25/2013 Elsevier Interactive Patient Education  2017 Elsevier Inc.  

## 2017-04-03 DIAGNOSIS — H409 Unspecified glaucoma: Secondary | ICD-10-CM

## 2017-04-03 HISTORY — DX: Unspecified glaucoma: H40.9

## 2017-06-04 ENCOUNTER — Telehealth: Payer: Self-pay | Admitting: Sports Medicine

## 2017-06-04 DIAGNOSIS — Z0000 Encounter for general adult medical examination without abnormal findings: Secondary | ICD-10-CM

## 2017-06-04 NOTE — Telephone Encounter (Signed)
Pt called and scheduled his physical for May and would like to get his blood work done before his appointment if possible. Thanks

## 2017-06-04 NOTE — Telephone Encounter (Signed)
Orders placed.

## 2017-06-04 NOTE — Telephone Encounter (Signed)
Called pt and informed orders sent to lab and can go at anytime prior to his appointment to have them done. KG LPN

## 2017-07-18 ENCOUNTER — Ambulatory Visit: Payer: BC Managed Care – PPO | Admitting: Family Medicine

## 2017-07-18 ENCOUNTER — Encounter: Payer: Self-pay | Admitting: Family Medicine

## 2017-07-18 VITALS — BP 120/78 | HR 88 | Temp 98.8°F | Ht 71.0 in | Wt 203.0 lb

## 2017-07-18 DIAGNOSIS — J0100 Acute maxillary sinusitis, unspecified: Secondary | ICD-10-CM | POA: Diagnosis not present

## 2017-07-18 DIAGNOSIS — M79642 Pain in left hand: Secondary | ICD-10-CM

## 2017-07-18 MED ORDER — DOXYCYCLINE HYCLATE 100 MG PO TABS: 100.0000 mg | ORAL_TABLET | Freq: Two times a day (BID) | ORAL | 0 refills | Status: DC

## 2017-07-18 NOTE — Patient Instructions (Signed)
Sinus Rinse What is a sinus rinse? A sinus rinse is a simple home treatment that is used to rinse your sinuses with a sterile mixture of salt and water (saline solution). Sinuses are air-filled spaces in your skull behind the bones of your face and forehead that open into your nasal cavity. You will use the following:  Saline solution.  Neti pot or spray bottle. This releases the saline solution into your nose and through your sinuses. Neti pots and spray bottles can be purchased at your local pharmacy, a health food store, or online.  When would I do a sinus rinse? A sinus rinse can help to clear mucus, dirt, dust, or pollen from the nasal cavity. You may do a sinus rinse when you have a cold, a virus, nasal allergy symptoms, a sinus infection, or stuffiness in the nose or sinuses. If you are considering a sinus rinse:  Ask your child's health care provider before performing a sinus rinse on your child.  Do not do a sinus rinse if you have had ear or nasal surgery, ear infection, or blocked ears.  How do I do a sinus rinse?  Wash your hands.  Disinfect your device according to the directions provided and then dry it.  Use the solution that comes with your device or one that is sold separately in stores. Follow the mixing directions on the package.  Fill your device with the amount of saline solution as directed by the device instructions.  Stand over a sink and tilt your head sideways over the sink.  Place the spout of the device in your upper nostril (the one closer to the ceiling).  Gently pour or squeeze the saline solution into the nasal cavity. The liquid should drain to the lower nostril if you are not overly congested.  Gently blow your nose. Blowing too hard may cause ear pain.  Repeat in the other nostril.  Clean and rinse your device with clean water and then air-dry it. Are there risks of a sinus rinse? Sinus rinse is generally very safe and effective. However,  there are a few risks, which include:  A burning sensation in the sinuses. This may happen if you do not make the saline solution as directed. Make sure to follow all directions when making the saline solution.  Infection from contaminated water. This is rare, but possible.  Nasal irritation.  This information is not intended to replace advice given to you by your health care provider. Make sure you discuss any questions you have with your health care provider. Document Released: 10/15/2013 Document Revised: 02/15/2016 Document Reviewed: 08/05/2013 Elsevier Interactive Patient Education  2017 Elsevier Inc.  

## 2017-07-18 NOTE — Progress Notes (Signed)
Subjective:    Patient ID: Adam Hatfield, male    DOB: 1950/09/12, 67 y.o.   MRN: [id number]  HPI 67 year old male with a history of coronary artery disease comes in today complaining of nasal congestion and drainage that started on Monday.  Yesterday he was outside playing golf and just suddenly felt worse.  He is never had problems with seasonal allergies before.  He took some NyQuil tabs last night.  He felt a little warm last evening but was not really sure if he was running a fever or not.  He denies any sore throat or ear pain.  No significant cough or shortness of breath or wheezing.  Concerned that he may have the flu but he did get a flu shot this year.  Had some questions about the new shingles vaccine.  He had the old one back in 2017 but wanted to know if he should consider getting the new one as well.   He also has problems with pain in his left hand particularly between the first finger and the thumb.  He says he can grip normally but will sometimes when he opens or extends his hand it is painful.  He denies any known trauma.  He says the joints do not seem to swell but wants to know if we could check for any type of arthritis on his blood work.  Review of Systems  BP 120/78   Pulse 88   Temp 98.8 F (37.1 C)   Ht 5\' 11"  (1.803 m)   Wt 203 lb (92.1 kg)   SpO2 98%   BMI 28.31 kg/m     Allergies  Allergen Reactions  . Amoxicillin Swelling  . Biaxin [Clarithromycin] Swelling    Past Medical History:  Diagnosis Date  . Hyperlipidemia     Past Surgical History:  Procedure Laterality Date  . HEMORRHOID SURGERY  [id number]    Social History   Socioeconomic History  . Marital status: Married    Spouse name: Not on file  . Number of children: Not on file  . Years of education: Not on file  . Highest education level: Not on file  Occupational History  . Not on file  Social Needs  . Financial resource strain: Not on file  . Food insecurity:    Worry: Not on  file    Inability: Not on file  . Transportation needs:    Medical: Not on file    Non-medical: Not on file  Tobacco Use  . Smoking status: Former Smoker    Last attempt to quit: 04/03/2010    Years since quitting: 7.2  . Smokeless tobacco: Never Used  Substance and Sexual Activity  . Alcohol use: Yes  . Drug use: No  . Sexual activity: Yes    Birth control/protection: None  Lifestyle  . Physical activity:    Days per week: Not on file    Minutes per session: Not on file  . Stress: Not on file  Relationships  . Social connections:    Talks on phone: Not on file    Gets together: Not on file    Attends religious service: Not on file    Active member of club or organization: Not on file    Attends meetings of clubs or organizations: Not on file    Relationship status: Not on file  . Intimate partner violence:    Fear of current or ex partner: Not on file    Emotionally abused: Not on  file    Physically abused: Not on file    Forced sexual activity: Not on file  Other Topics Concern  . Not on file  Social History Narrative  . Not on file    Family History  Problem Relation Age of Onset  . Heart disease Mother   . Stroke Mother     Outpatient Encounter Medications as of 07/18/2017  Medication Sig  . TRAVATAN Z 0.004 % SOLN ophthalmic solution Place 1 drop into both eyes at bedtime.  Marland Kitchen. aspirin EC 81 MG tablet Take 1 tablet (81 mg total) by mouth daily.  Marland Kitchen. doxycycline (VIBRA-TABS) 100 MG tablet Take 1 tablet (100 mg total) by mouth 2 (two) times daily.  . psyllium (METAMUCIL) 58.6 % powder Take 1 packet by mouth as needed.  . [DISCONTINUED] Creatine Monohydrate POWD 20 g by mouth daily for 5 days then 5 g by mouth daily (Patient not taking: Reported on 11/02/2016)  . [DISCONTINUED] cyclobenzaprine (FLEXERIL) 10 MG tablet Take 1 tablet (10 mg total) by mouth 3 (three) times daily as needed for muscle spasms.  . [DISCONTINUED] magnesium oxide (MAG-OX) 400 MG tablet Take 2  tablets (800 mg total) by mouth at bedtime. (Patient not taking: Reported on 11/02/2016)   No facility-administered encounter medications on file as of 07/18/2017.          Objective:   Physical Exam  Constitutional: He is oriented to person, place, and time. He appears well-developed and well-nourished.  HENT:  Head: Normocephalic and atraumatic.  Right Ear: External ear normal.  Left Ear: External ear normal.  Nose: Nose normal.  Mouth/Throat: Oropharynx is clear and moist.  TMs and canals are clear.   Eyes: Pupils are equal, round, and reactive to light. Conjunctivae and EOM are normal.  Neck: Neck supple. No thyromegaly present.  Cardiovascular: Normal rate and normal heart sounds.  Pulmonary/Chest: Effort normal and breath sounds normal.  Lymphadenopathy:    He has no cervical adenopathy.  Neurological: He is alert and oriented to person, place, and time.  Skin: Skin is warm and dry.  Psychiatric: He has a normal mood and affect.       Assessment & Plan:  Acute sinusitis-likely viral.  At this point he is really only had symptoms for about 3-4 days.  Encouraged him to use nasal saline and okay to continue with his DayQuil and NyQuil.  If he is not getting better as we had into the weekend and okay to fill the prescription for doxycycline.  I did print the prescription and gave it to him in case he needs it as we will be closed on Friday for the holiday and he will be leaving town for travel this weekend.  Discussed new shingles vaccine.  He would like to have this done so we will add him to our list to call once we get a new shipment in as it is currently on back order.  Left hand pain-explained that we are anterior type of arthritis does not show up on blood work but that there are certain types of autoimmune disorders that can.  No family history of rheumatoid arthritis.  He wants to see if any of the blood work could be added on to his labs that he is going to have drawn next  month for his physical.  We will see if we can add a sed rate and CRP.  If elevated then Dr. Benjamin Stainhekkekandam, his primary care provider can do additional workup.

## 2017-08-13 ENCOUNTER — Encounter: Payer: Self-pay | Admitting: Sports Medicine

## 2017-08-13 DIAGNOSIS — N400 Enlarged prostate without lower urinary tract symptoms: Secondary | ICD-10-CM | POA: Insufficient documentation

## 2017-08-14 ENCOUNTER — Telehealth: Payer: Self-pay | Admitting: Emergency Medicine

## 2017-08-14 NOTE — Telephone Encounter (Signed)
Patient needs coding changed on labs ordered at last visit; must be specific versus just annual physical; once specific his health insurance will cover the fees.

## 2017-08-15 LAB — CBC
HCT: 42.1 % (ref 38.5–50.0)
Hemoglobin: 14.8 g/dL (ref 13.2–17.1)
MCH: 34.5 pg — ABNORMAL HIGH (ref 27.0–33.0)
MCHC: 35.2 g/dL (ref 32.0–36.0)
MCV: 98.1 fL (ref 80.0–100.0)
MPV: 10.1 fL (ref 7.5–12.5)
Platelets: 299 Thousand/uL (ref 140–400)
RBC: 4.29 Million/uL (ref 4.20–5.80)
RDW: 13.5 % (ref 11.0–15.0)
WBC: 3.7 10*3/uL — ABNORMAL LOW (ref 3.8–10.8)

## 2017-08-15 LAB — COMPREHENSIVE METABOLIC PANEL WITH GFR
AG Ratio: 1.7 (calc) (ref 1.0–2.5)
Albumin: 4.2 g/dL (ref 3.6–5.1)
Alkaline phosphatase (APISO): 60 U/L (ref 40–115)
CO2: 28 mmol/L (ref 20–32)
Calcium: 9.1 mg/dL (ref 8.6–10.3)
Potassium: 4.2 mmol/L (ref 3.5–5.3)
Sodium: 137 mmol/L (ref 135–146)
Total Bilirubin: 0.7 mg/dL (ref 0.2–1.2)
Total Protein: 6.7 g/dL (ref 6.1–8.1)

## 2017-08-15 LAB — HEMOGLOBIN A1C
Hgb A1c MFr Bld: 6.1 % of total Hgb — ABNORMAL HIGH (ref <5.7)
Mean Plasma Glucose: 128 (calc)
eAG (mmol/L): 7.1 (calc)

## 2017-08-15 LAB — LIPID PANEL W/REFLEX DIRECT LDL
Cholesterol: 202 mg/dL — ABNORMAL HIGH (ref <200)
HDL: 41 mg/dL (ref >40)
LDL Cholesterol (Calc): 135 mg/dL — ABNORMAL HIGH
Non-HDL Cholesterol (Calc): 161 mg/dL — ABNORMAL HIGH (ref <130)
Total CHOL/HDL Ratio: 4.9 (calc) (ref <5.0)
Triglycerides: 134 mg/dL (ref <150)

## 2017-08-15 LAB — COMPREHENSIVE METABOLIC PANEL
ALT: 21 U/L (ref 9–46)
AST: 18 U/L (ref 10–35)
BUN: 11 mg/dL (ref 7–25)
Chloride: 105 mmol/L (ref 98–110)
Creat: 1.04 mg/dL (ref 0.70–1.25)
Globulin: 2.5 g/dL (calc) (ref 1.9–3.7)
Glucose, Bld: 109 mg/dL — ABNORMAL HIGH (ref 65–99)

## 2017-08-15 LAB — PSA, TOTAL AND FREE
PSA, % Free: 50 % (calc) (ref >25)
PSA, Free: 0.2 ng/mL
PSA, Total: 0.4 ng/mL (ref <=4.0)

## 2017-08-15 LAB — TSH: TSH: 1.18 mIU/L (ref 0.40–4.50)

## 2017-08-15 LAB — HIV ANTIBODY (ROUTINE TESTING W REFLEX): HIV 1&2 Ab, 4th Generation: NONREACTIVE

## 2017-08-15 LAB — HEPATITIS C ANTIBODY
Hepatitis C Ab: NONREACTIVE
SIGNAL TO CUT-OFF: 0.03 (ref <1.00)

## 2017-08-17 ENCOUNTER — Encounter: Payer: BC Managed Care – PPO | Admitting: Sports Medicine

## 2017-08-21 ENCOUNTER — Encounter: Payer: Self-pay | Admitting: Sports Medicine

## 2017-08-21 ENCOUNTER — Ambulatory Visit: Payer: Medicare Other

## 2017-08-21 ENCOUNTER — Ambulatory Visit (INDEPENDENT_AMBULATORY_CARE_PROVIDER_SITE_OTHER): Payer: Medicare Other

## 2017-08-21 ENCOUNTER — Ambulatory Visit (INDEPENDENT_AMBULATORY_CARE_PROVIDER_SITE_OTHER): Payer: Medicare Other | Admitting: Sports Medicine

## 2017-08-21 VITALS — BP 128/74 | HR 91 | Ht 70.98 in | Wt 197.0 lb

## 2017-08-21 DIAGNOSIS — H9313 Tinnitus, bilateral: Secondary | ICD-10-CM | POA: Diagnosis not present

## 2017-08-21 DIAGNOSIS — M1812 Unilateral primary osteoarthritis of first carpometacarpal joint, left hand: Secondary | ICD-10-CM

## 2017-08-21 DIAGNOSIS — Z0000 Encounter for general adult medical examination without abnormal findings: Secondary | ICD-10-CM

## 2017-08-21 DIAGNOSIS — M25542 Pain in joints of left hand: Secondary | ICD-10-CM

## 2017-08-21 DIAGNOSIS — Z23 Encounter for immunization: Secondary | ICD-10-CM

## 2017-08-21 DIAGNOSIS — H9319 Tinnitus, unspecified ear: Secondary | ICD-10-CM | POA: Insufficient documentation

## 2017-08-21 MED ORDER — CELECOXIB 200 MG PO CAPS: ORAL_CAPSULE | ORAL | 2 refills | Status: DC

## 2017-08-21 NOTE — Assessment & Plan Note (Signed)
Annual physical as above, lung cancer screening today, we will do this yearly until 2027. Aortic ultrasound ordered. Pneumococcal 13, return in 1 year for 23.

## 2017-08-21 NOTE — Progress Notes (Signed)
Subjective:    CC: Annual physical exam; ringing in ears; left hand pain  HPI: Adam Hatfield, a 67yo male with a pmh significant for CAD and Hyperlipidemia,  presents in clinic today for annual physical exam and with cc of hand pain and ringing in the ears.   The pt reports that the hand pain (centered around the carpatometacarpal joint for the left thumb) began about a year ago and occurs sporadically,  hours removed from increased activity.  Pt reports that he does not take anything to alleviate the pain and it  spontaneously resolves.  Pt reports that the pain in his hand is exacerbated with hyperextension of his thumb and he will flex his digits to help alleviate the pain.  Pt denies mechanical symptoms of locking and/or popping and/or catching but does report that the pain can be so intense that he does not want to move it.  Pt reports that the ringing in his ears has been going on for A few (2-3) years.  Pt reports that he notices the ringing when he is in a quiet place, but it does not impair his ability to function.  Pt reports that on average it occurs once a day for approximately an hour and will spontaneously resolve.    Lastly, Pt reports that he does not eat a single serving a fruits and vegetables every day but he enjoy apples. Pt also reports avoidance of fatty/fried foods and will order a salad when at a fast food restaurant or ingest the grilled form of chicken.      I reviewed the past medical history, family history, social history, surgical history, and allergies today and no changes were needed.  Please see the problem list section below in epic for further details.  Past Medical History: Past Medical History:  Diagnosis Date  . Hyperlipidemia    Past Surgical History: Past Surgical History:  Procedure Laterality Date  . HEMORRHOID SURGERY  [id number]   Social History: Social History   Socioeconomic History  . Marital status: Married    Spouse name: Not on file    . Number of children: Not on file  . Years of education: Not on file  . Highest education level: Not on file  Occupational History  . Not on file  Social Needs  . Financial resource strain: Not on file  . Food insecurity:    Worry: Not on file    Inability: Not on file  . Transportation needs:    Medical: Not on file    Non-medical: Not on file  Tobacco Use  . Smoking status: Former Smoker    Last attempt to quit: 04/03/2010    Years since quitting: 7.3  . Smokeless tobacco: Never Used  Substance and Sexual Activity  . Alcohol use: Yes  . Drug use: No  . Sexual activity: Yes    Birth control/protection: None  Lifestyle  . Physical activity:    Days per week: Not on file    Minutes per session: Not on file  . Stress: Not on file  Relationships  . Social connections:    Talks on phone: Not on file    Gets together: Not on file    Attends religious service: Not on file    Active member of club or organization: Not on file    Attends meetings of clubs or organizations: Not on file    Relationship status: Not on file  Other Topics Concern  . Not on file  Social  History Narrative  . Not on file   Family History: Family History  Problem Relation Age of Onset  . Heart disease Mother   . Stroke Mother    Allergies: Allergies  Allergen Reactions  . Amoxicillin Swelling  . Biaxin [Clarithromycin] Swelling   Medications: See med rec.  Review of Systems: No headache, visual changes, nausea, vomiting, diarrhea, constipation, dizziness, abdominal pain, skin rash, fevers, chills, night sweats, swollen lymph nodes, weight loss, chest pain, body aches, joint swelling, muscle aches, shortness of breath, mood changes, visual or auditory hallucinations.  Objective:    General: Well Developed, well nourished, and in no acute distress.  Neuro: Alert and oriented x3, extra-ocular muscles intact, sensation grossly intact.  HEENT: Normocephalic, atraumatic, pupils equal round  reactive to light, neck supple, no masses, no lymphadenopathy, thyroid nonpalpable.  Skin: Warm and dry, no rashes noted.  Cardiac: Regular rate and rhythm, no murmurs rubs or gallops.  Respiratory: Clear to auscultation bilaterally. Not using accessory muscles, speaking in full sentences.  Abdominal: Soft, nontender, nondistended, positive bowel sounds, no masses, no organomegaly.  Musculoskeletal: Shoulder, elbow, wrist, hip, knee, ankle stable, and with full range of motion.  Right Wrist: Inspection normal with no visible erythema or swelling. ROM smooth and normal with good flexion and extension and ulnar/radial deviation that is symmetrical with opposite wrist. Some pain felt at carpatometacarpal joint with hyperextension of thumb Palpation is normal over metacarpals, navicular, lunate, and TFCC; tendons without tenderness/ swelling No snuffbox tenderness. No tenderness over Canal of Guyon. Strength 5/5 in all directions without pain. Negative Finkelstein, tinel's and phalens. Negative Watson's test.   Impression and Recommendations:    The patient was counseled on ways to increase servings of fruit and vegetables (from multiple times a week to at least one serving everyday) and  on risk factors to adverse cardiovascular outcomes; anticipatory guidance was Given. Labs were reviewed  and the significance to overall health was discussed;   Assessment-Presbycusis Will not be treated at this time because pt has reported that it does not impair function   Assessement- Left hand Metacarpal/Carpal Thumb Osteoarthritis   Imaging (Xray) to evaluate extent of pathology; as well as prescription for an antiinflammatory agent (celebrix) for alleviaiton of pain.  Pt was educated about diagnosis and provided a informative handout with rehabilitative exercises included.  Pt was advised to complete the exercises nightly. Pt was also advised to follow up in 1 month to evaluate hand pain and counseled on  next steps if the pain has not resolved with current regimen of celebrix and PT (injection).  Health Maintenance: PNA-13 Vaccine; Return for Shingrix vaccine  Not administered and will need to return when vaccine is available  Lung Cancer Screening-Pt advised to schedule lung cancer (CT) screening when he completes imaging of left hand (xray) AAA Screening- Pt advised that he will receive call about ultrasonagraphy appt ________________________________________  ___ Ihor Austin. Benjamin Stain, M.D., ABFM., CAQSM. Primary Care and Sports Medicine [facility]  Adjunct Instructor of Family Medicine  [employer]

## 2017-08-21 NOTE — Assessment & Plan Note (Signed)
Likely related to presbycusis. Does not really bother patient, no intervention needed.

## 2017-08-21 NOTE — Assessment & Plan Note (Signed)
X-rays, Celebrex, return in 1 month, injection if no better.

## 2017-08-22 ENCOUNTER — Encounter: Payer: BC Managed Care – PPO | Admitting: Sports Medicine

## 2017-08-28 ENCOUNTER — Ambulatory Visit (HOSPITAL_BASED_OUTPATIENT_CLINIC_OR_DEPARTMENT_OTHER)
Admission: RE | Admit: 2017-08-28 | Discharge: 2017-08-28 | Disposition: A | Discharge status: Home / Self Care | Payer: Medicare Other | Source: Ambulatory Visit | Attending: Sports Medicine | Admitting: Sports Medicine

## 2017-08-28 DIAGNOSIS — I2510 Atherosclerotic heart disease of native coronary artery without angina pectoris: Secondary | ICD-10-CM | POA: Diagnosis not present

## 2017-08-28 DIAGNOSIS — Z87891 Personal history of nicotine dependence: Secondary | ICD-10-CM | POA: Insufficient documentation

## 2017-08-28 DIAGNOSIS — Z0000 Encounter for general adult medical examination without abnormal findings: Secondary | ICD-10-CM | POA: Diagnosis not present

## 2017-08-28 DIAGNOSIS — E785 Hyperlipidemia, unspecified: Secondary | ICD-10-CM | POA: Insufficient documentation

## 2017-08-28 DIAGNOSIS — I700 Atherosclerosis of aorta: Secondary | ICD-10-CM | POA: Insufficient documentation

## 2017-09-18 ENCOUNTER — Ambulatory Visit: Payer: Medicare Other | Admitting: Sports Medicine

## 2017-10-02 ENCOUNTER — Encounter: Payer: Self-pay | Admitting: Sports Medicine

## 2017-10-02 ENCOUNTER — Ambulatory Visit (INDEPENDENT_AMBULATORY_CARE_PROVIDER_SITE_OTHER): Payer: Medicare Other | Admitting: Sports Medicine

## 2017-10-02 DIAGNOSIS — M1812 Unilateral primary osteoarthritis of first carpometacarpal joint, left hand: Secondary | ICD-10-CM | POA: Diagnosis not present

## 2017-10-02 NOTE — Progress Notes (Signed)
Subjective:    CC: Left hand pain  HPI: This is a pleasant 67 year old male, he has trapeziometacarpal joint osteoarthritis on the left, we started Celebrex and he had a partial response, persistent pain localized at the base of the thumb, moderate, persistent without radiation.  I reviewed the past medical history, family history, social history, surgical history, and allergies today and no changes were needed.  Please see the problem list section below in epic for further details.  Past Medical History: Past Medical History:  Diagnosis Date  . Hyperlipidemia    Past Surgical History: Past Surgical History:  Procedure Laterality Date  . HEMORRHOID SURGERY  [id number]   Social History: Social History   Socioeconomic History  . Marital status: Married    Spouse name: Not on file  . Number of children: Not on file  . Years of education: Not on file  . Highest education level: Not on file  Occupational History  . Not on file  Social Needs  . Financial resource strain: Not on file  . Food insecurity:    Worry: Not on file    Inability: Not on file  . Transportation needs:    Medical: Not on file    Non-medical: Not on file  Tobacco Use  . Smoking status: Former Smoker    Last attempt to quit: 04/03/2010    Years since quitting: 7.5  . Smokeless tobacco: Never Used  Substance and Sexual Activity  . Alcohol use: Yes  . Drug use: No  . Sexual activity: Yes    Birth control/protection: None  Lifestyle  . Physical activity:    Days per week: 3 days    Minutes per session: Not on file  . Stress: Not on file  Relationships  . Social connections:    Talks on phone: Not on file    Gets together: Not on file    Attends religious service: Not on file    Active member of club or organization: Not on file    Attends meetings of clubs or organizations: Not on file    Relationship status: Not on file  Other Topics Concern  . Not on file  Social History Narrative  . Not on  file   Family History: Family History  Problem Relation Age of Onset  . Heart disease Mother   . Stroke Mother    Allergies: Allergies  Allergen Reactions  . Amoxicillin Swelling  . Biaxin [Clarithromycin] Swelling   Medications: See med rec.  Review of Systems: No fevers, chills, night sweats, weight loss, chest pain, or shortness of breath.   Objective:    General: Well Developed, well nourished, and in no acute distress.  Neuro: Alert and oriented x3, extra-ocular muscles intact, sensation grossly intact.  HEENT: Normocephalic, atraumatic, pupils equal round reactive to light, neck supple, no masses, no lymphadenopathy, thyroid nonpalpable.  Skin: Warm and dry, no rashes. Cardiac: Regular rate and rhythm, no murmurs rubs or gallops, no lower extremity edema.  Respiratory: Clear to auscultation bilaterally. Not using accessory muscles, speaking in full sentences. Left hand: Tender to palpation at the thumb basal joint.  Procedure: Real-time Ultrasound Guided Injection of left first [facility] Device: GE Logiq E  Verbal informed consent obtained.  Time-out conducted.  Noted no overlying erythema, induration, or other signs of local infection.  Skin prepped in a sterile fashion.  Local anesthesia: Topical Ethyl chloride.  With sterile technique and under real time ultrasound guidance: 1/2 cc Kenalog 40, 1/2 cc lidocaine injected  easily Completed without difficulty  Pain immediately resolved suggesting accurate placement of the medication.  Advised to call if fevers/chills, erythema, induration, drainage, or persistent bleeding.  Images permanently stored and available for review in the ultrasound unit.  Impression: Technically successful ultrasound guided injection.  Impression and Recommendations:    Primary osteoarthritis of first carpometacarpal joint of one hand, left Intermittent pain in spite of Celebrex. First CMC joint injection as above, return in 1  month. ___________________________________________ Ihor Austinhomas J. Benjamin Stainhekkekandam, M.D., ABFM., CAQSM. Primary Care and Sports Medicine [facility]  Adjunct Instructor of Family Medicine  [employer]

## 2017-10-02 NOTE — Assessment & Plan Note (Signed)
Intermittent pain in spite of Celebrex. First CMC joint injection as above, return in 1 month.

## 2017-10-09 ENCOUNTER — Telehealth: Payer: Self-pay

## 2017-10-09 MED ORDER — ZOSTER VAC RECOMB ADJUVANTED 50 MCG/0.5ML IM SUSR: 0.5000 mL | Freq: Once | INTRAMUSCULAR | 1 refills | Status: AC

## 2017-10-09 NOTE — Telephone Encounter (Signed)
Signed order!  Thanks so much for putting this in.

## 2017-10-09 NOTE — Telephone Encounter (Signed)
Pt was on wait list for Shingrix Vaccine.    Pt insurance will not cover for this to be given in office.   RX pended to send to pharmacy for vaccine.   Please route back so I can let pt know.    Thanks! 

## 2017-10-09 NOTE — Telephone Encounter (Signed)
Called and left pt a message advising him that since he has Medicare, insurance will not cover him to get this injection at the doctor's office. Advised pt that RX was sent to [facility] for him to get vaccine there. Call back info provided for any concerns

## 2017-11-19 ENCOUNTER — Other Ambulatory Visit: Payer: Self-pay | Admitting: Sports Medicine

## 2017-11-19 DIAGNOSIS — M1812 Unilateral primary osteoarthritis of first carpometacarpal joint, left hand: Secondary | ICD-10-CM

## 2017-12-18 ENCOUNTER — Encounter: Payer: Self-pay | Admitting: Sports Medicine

## 2017-12-18 ENCOUNTER — Ambulatory Visit (INDEPENDENT_AMBULATORY_CARE_PROVIDER_SITE_OTHER): Payer: Medicare Other | Admitting: Sports Medicine

## 2017-12-18 DIAGNOSIS — R05 Cough: Secondary | ICD-10-CM

## 2017-12-18 DIAGNOSIS — R059 Cough, unspecified: Secondary | ICD-10-CM | POA: Insufficient documentation

## 2017-12-18 MED ORDER — FLUTICASONE PROPIONATE 50 MCG/ACT NA SUSP
NASAL | 3 refills | Status: DC
Start: 2017-12-18 — End: 2019-08-28

## 2017-12-18 NOTE — Patient Instructions (Signed)
Postnasal Drip  Postnasal drip is the feeling of mucus going down the back of your throat. Mucus is a slimy substance that moistens and cleans your nose and throat, as well as the air pockets in face bones near your forehead and cheeks (sinuses). Small amounts of mucus pass from your nose and sinuses down the back of your throat all the time. This is normal. When you produce too much mucus or the mucus gets too thick, you can feel it.  Some common causes of postnasal drip include:  · Having more mucus because of:  ? A cold or the flu.  ? Allergies.  ? Cold air.  ? Certain medicines.  · Having more mucus that is thicker because of:  ? A sinus or nasal infection.  ? Dry air.  ? A food allergy.    Follow these instructions at home:  Relieving discomfort  · Gargle with a salt-water mixture 3–4 times a day or as needed. To make a salt-water mixture, completely dissolve ½–1 tsp of salt in 1 cup of warm water.  · If the air in your home is dry, use a humidifier to add moisture to the air.  · Use a saline spray or container (neti pot) to flush out the nose (nasal irrigation). These methods can help clear away mucus and keep the nasal passages moist.  General instructions  · Take over-the-counter and prescription medicines only as told by your health care provider.  · Follow instructions from your health care provider about eating or drinking restrictions. You may need to avoid caffeine.  · Avoid things that you know you are allergic to (allergens), like dust, mold, pollen, pets, or certain foods.  · Drink enough fluid to keep your urine pale yellow.  · Keep all follow-up visits as told by your health care provider. This is important.  Contact a health care provider if:  · You have a fever.  · You have a sore throat.  · You have difficulty swallowing.  · You have headache.  · You have sinus pain.  · You have a cough that does not go away.  · The mucus from your nose becomes thick and is green or yellow in color.   · You have cold or flu symptoms that last more than 10 days.  Summary  · Postnasal drip is the feeling of mucus going down the back of your throat.  · If your health care provider approves, use nasal irrigation or a nasal spray 2?4 times a day.  · Avoid things that you know you are allergic to (allergens), like dust, mold, pollen, pets, or certain foods.  This information is not intended to replace advice given to you by your health care provider. Make sure you discuss any questions you have with your health care provider.  Document Released: 07/03/2016 Document Revised: 07/03/2016 Document Reviewed: 07/03/2016  Elsevier Interactive Patient Education © 2018 Elsevier Inc.

## 2017-12-18 NOTE — Progress Notes (Signed)
Subjective:    CC: Coughing  HPI: This is a pleasant 67 year old male, previously healthy, over the past couple of weeks he has felt somewhat rundown, he went on a trip with his wife, came home and felt kind of tired but then worked out in the yard and developed a cough.  Overall his symptoms are resolving, no fevers, chills, chest pain, shortness of breath.  No wheezing.  He does have a bit of nasal stuffiness, cough is minimally productive of sputum of varying colors, no blood.  Symptoms are continuing to improve, all upper respiratory symptoms have resolved.  Only has a mild cough.  I reviewed the past medical history, family history, social history, surgical history, and allergies today and no changes were needed.  Please see the problem list section below in epic for further details.  Past Medical History: Past Medical History:  Diagnosis Date  . Hyperlipidemia    Past Surgical History: Past Surgical History:  Procedure Laterality Date  . HEMORRHOID SURGERY  [id number]   Social History: Social History   Socioeconomic History  . Marital status: Married    Spouse name: Not on file  . Number of children: Not on file  . Years of education: Not on file  . Highest education level: Not on file  Occupational History  . Not on file  Social Needs  . Financial resource strain: Not on file  . Food insecurity:    Worry: Not on file    Inability: Not on file  . Transportation needs:    Medical: Not on file    Non-medical: Not on file  Tobacco Use  . Smoking status: Former Smoker    Last attempt to quit: 04/03/2010    Years since quitting: 7.7  . Smokeless tobacco: Never Used  Substance and Sexual Activity  . Alcohol use: Yes  . Drug use: No  . Sexual activity: Yes    Birth control/protection: None  Lifestyle  . Physical activity:    Days per week: 3 days    Minutes per session: Not on file  . Stress: Not on file  Relationships  . Social connections:    Talks on phone: Not  on file    Gets together: Not on file    Attends religious service: Not on file    Active member of club or organization: Not on file    Attends meetings of clubs or organizations: Not on file    Relationship status: Not on file  Other Topics Concern  . Not on file  Social History Narrative  . Not on file   Family History: Family History  Problem Relation Age of Onset  . Heart disease Mother   . Stroke Mother    Allergies: Allergies  Allergen Reactions  . Amoxicillin Swelling  . Biaxin [Clarithromycin] Swelling   Medications: See med rec.  Review of Systems: No fevers, chills, night sweats, weight loss, chest pain, or shortness of breath.   Objective:    General: Well Developed, well nourished, and in no acute distress.  Neuro: Alert and oriented x3, extra-ocular muscles intact, sensation grossly intact.  HEENT: Normocephalic, atraumatic, pupils equal round reactive to light, neck supple, no masses, no lymphadenopathy, thyroid nonpalpable.  Oropharynx, nasopharynx, ear canals unremarkable. Skin: Warm and dry, no rashes. Cardiac: Regular rate and rhythm, no murmurs rubs or gallops, no lower extremity edema.  Respiratory: Clear to auscultation bilaterally. Not using accessory muscles, speaking in full sentences.  Impression and Recommendations:    Coughing  Completely benign and unremarkable exam. Suspect postnasal drip syndrome. Adding Flonase, may use over-the-counter cough suppressants, he will call me if symptoms worsen and we can do an antibiotic. Return as needed.  ___________________________________________ Ihor Austin. Benjamin Stain, M.D., ABFM., CAQSM. Primary Care and Sports Medicine [facility]  Adjunct Instructor of Family Medicine  [employer]

## 2017-12-18 NOTE — Assessment & Plan Note (Signed)
Completely benign and unremarkable exam. Suspect postnasal drip syndrome. Adding Flonase, may use over-the-counter cough suppressants, he will call me if symptoms worsen and we can do an antibiotic. Return as needed.

## 2018-08-05 ENCOUNTER — Telehealth: Payer: Self-pay | Admitting: Sports Medicine

## 2018-08-05 DIAGNOSIS — I2584 Coronary atherosclerosis due to calcified coronary lesion: Secondary | ICD-10-CM

## 2018-08-05 DIAGNOSIS — I2510 Atherosclerotic heart disease of native coronary artery without angina pectoris: Secondary | ICD-10-CM

## 2018-08-05 DIAGNOSIS — N400 Enlarged prostate without lower urinary tract symptoms: Secondary | ICD-10-CM

## 2018-08-05 NOTE — Telephone Encounter (Signed)
Patient schedule his physical, orders placed for labs fasting beforehand.

## 2018-08-19 ENCOUNTER — Telehealth: Payer: Self-pay | Admitting: Sports Medicine

## 2018-08-19 DIAGNOSIS — I2510 Atherosclerotic heart disease of native coronary artery without angina pectoris: Secondary | ICD-10-CM

## 2018-08-19 DIAGNOSIS — N400 Enlarged prostate without lower urinary tract symptoms: Secondary | ICD-10-CM

## 2018-08-19 NOTE — Telephone Encounter (Signed)
Done

## 2018-08-19 NOTE — Telephone Encounter (Signed)
Pt is scheduled for his CPE next week with Dr.T and wants to make sure his lab orders get sent to lab so he can go this week and he also wants to make sure he gets a PSA

## 2018-08-21 LAB — CBC
HCT: 42.2 % (ref 38.5–50.0)
Hemoglobin: 15 g/dL (ref 13.2–17.1)
MCH: 35.7 pg — ABNORMAL HIGH (ref 27.0–33.0)
MCHC: 35.5 g/dL (ref 32.0–36.0)
MCV: 100.5 fL — ABNORMAL HIGH (ref 80.0–100.0)
MPV: 10.3 fL (ref 7.5–12.5)
Platelets: 346 10*3/uL (ref 140–400)
RBC: 4.2 10*6/uL (ref 4.20–5.80)
RDW: 13.7 % (ref 11.0–15.0)
WBC: 3.6 10*3/uL — ABNORMAL LOW (ref 3.8–10.8)

## 2018-08-21 LAB — COMPLETE METABOLIC PANEL WITH GFR
AG Ratio: 1.9 (calc) (ref 1.0–2.5)
ALT: 26 U/L (ref 9–46)
AST: 26 U/L (ref 10–35)
Albumin: 4.5 g/dL (ref 3.6–5.1)
Alkaline phosphatase (APISO): 57 U/L (ref 35–144)
BUN: 15 mg/dL (ref 7–25)
CO2: 26 mmol/L (ref 20–32)
Calcium: 9.3 mg/dL (ref 8.6–10.3)
Chloride: 104 mmol/L (ref 98–110)
Creat: 1.04 mg/dL (ref 0.70–1.25)
GFR, Est African American: 86 mL/min/{1.73_m2} (ref >=60)
GFR, Est Non African American: 74 mL/min/{1.73_m2} (ref >=60)
Globulin: 2.4 g/dL (calc) (ref 1.9–3.7)
Glucose, Bld: 107 mg/dL — ABNORMAL HIGH (ref 65–99)
Potassium: 4.1 mmol/L (ref 3.5–5.3)
Sodium: 138 mmol/L (ref 135–146)
Total Bilirubin: 0.6 mg/dL (ref 0.2–1.2)
Total Protein: 6.9 g/dL (ref 6.1–8.1)

## 2018-08-21 LAB — LIPID PANEL W/REFLEX DIRECT LDL
Cholesterol: 200 mg/dL — ABNORMAL HIGH (ref <200)
HDL: 44 mg/dL (ref >=40)
LDL Cholesterol (Calc): 137 mg/dL (calc) — ABNORMAL HIGH
Non-HDL Cholesterol (Calc): 156 mg/dL (calc) — ABNORMAL HIGH (ref <130)
Total CHOL/HDL Ratio: 4.5 (calc) (ref <5.0)
Triglycerides: 91 mg/dL (ref <150)

## 2018-08-21 LAB — PSA, TOTAL AND FREE
PSA, % Free: 40 % (calc) (ref >25)
PSA, Free: 0.2 ng/mL
PSA, Total: 0.5 ng/mL (ref <=4.0)

## 2018-08-21 LAB — HEMOGLOBIN A1C
Hgb A1c MFr Bld: 6 % of total Hgb — ABNORMAL HIGH (ref <5.7)
Mean Plasma Glucose: 126 (calc)
eAG (mmol/L): 7 (calc)

## 2018-08-21 LAB — TSH: TSH: 1.74 mIU/L (ref 0.40–4.50)

## 2018-08-27 ENCOUNTER — Encounter: Payer: Self-pay | Admitting: Sports Medicine

## 2018-08-27 ENCOUNTER — Ambulatory Visit (INDEPENDENT_AMBULATORY_CARE_PROVIDER_SITE_OTHER): Payer: Medicare Other | Admitting: Sports Medicine

## 2018-08-27 VITALS — BP 119/77 | HR 80 | Ht 71.0 in | Wt 199.0 lb

## 2018-08-27 DIAGNOSIS — M7542 Impingement syndrome of left shoulder: Secondary | ICD-10-CM

## 2018-08-27 DIAGNOSIS — Z0000 Encounter for general adult medical examination without abnormal findings: Secondary | ICD-10-CM

## 2018-08-27 DIAGNOSIS — Z23 Encounter for immunization: Secondary | ICD-10-CM | POA: Diagnosis not present

## 2018-08-27 DIAGNOSIS — E785 Hyperlipidemia, unspecified: Secondary | ICD-10-CM

## 2018-08-27 DIAGNOSIS — M7541 Impingement syndrome of right shoulder: Secondary | ICD-10-CM | POA: Insufficient documentation

## 2018-08-27 MED ORDER — ZOSTER VAC RECOMB ADJUVANTED 50 MCG/0.5ML IM SUSR: 0.5000 mL | Freq: Once | INTRAMUSCULAR | 0 refills | Status: AC

## 2018-08-27 MED ORDER — TETANUS-DIPHTH-ACELL PERTUSSIS 5-2.5-18.5 LF-MCG/0.5 IM SUSP: 0.5000 mL | Freq: Once | INTRAMUSCULAR | 0 refills | Status: AC

## 2018-08-27 NOTE — Assessment & Plan Note (Signed)
Restarting aggressive rehabilitation exercises, return in 6 weeks if needed.

## 2018-08-27 NOTE — Progress Notes (Signed)
Subjective:   Adam Hatfield is a 68 y.o. male who presents for Medicare Annual/Subsequent preventive examination.  Review of Systems:  A comprehensive review of systems is negative except as noted below   Objective:    Vitals: BP 119/77   Pulse 80   Ht  (1.803 m)   Wt 199 lb (90.3 kg)   BMI 27.75 kg/m   Body mass index is 27.75 kg/m.  Advanced Directives 11/02/2016  Does Patient Have a Medical Advance Directive? No  Would patient like information on creating a medical advance directive? No - Patient declined    Tobacco Social History   Tobacco Use  Smoking Status Former Smoker  . Last attempt to quit: 04/03/2010  . Years since quitting: 8.4  Smokeless Tobacco Never Used     Counseling given: Not Answered   Clinical Intake: Due for pneumococcal 23, Shingrix No. 2, Tdap.  Left shoulder pain: Localized over the deltoid, worse with overhead activities, moderate, persistent, did well over a year ago with rehabilitation exercises.   Past Medical History:  Diagnosis Date  . Hyperlipidemia    Past Surgical History:  Procedure Laterality Date  . HEMORRHOID SURGERY  [id number]   Family History  Problem Relation Age of Onset  . Heart disease Mother   . Stroke Mother    Social History   Socioeconomic History  . Marital status: Married    Spouse name: Not on file  . Number of children: Not on file  . Years of education: Not on file  . Highest education level: Not on file  Occupational History  . Not on file  Social Needs  . Financial resource strain: Not on file  . Food insecurity:    Worry: Not on file    Inability: Not on file  . Transportation needs:    Medical: Not on file    Non-medical: Not on file  Tobacco Use  . Smoking status: Former Smoker    Last attempt to quit: 04/03/2010    Years since quitting: 8.4  . Smokeless tobacco: Never Used  Substance and Sexual Activity  . Alcohol use: Yes  . Drug use: No  . Sexual activity: Yes   Birth control/protection: None  Lifestyle  . Physical activity:    Days per week: 3 days    Minutes per session: Not on file  . Stress: Not on file  Relationships  . Social connections:    Talks on phone: Not on file    Gets together: Not on file    Attends religious service: Not on file    Active member of club or organization: Not on file    Attends meetings of clubs or organizations: Not on file    Relationship status: Not on file  Other Topics Concern  . Not on file  Social History Narrative  . Not on file    Outpatient Encounter Medications as of 08/27/2018  Medication Sig  . aspirin EC 81 MG tablet Take 1 tablet (81 mg total) by mouth daily.  . celecoxib (CELEBREX) 200 MG capsule TAKE 1-2 CAPSULES EVERY DAY AS NEEDED FOR PAIN  . fluticasone (FLONASE) 50 MCG/ACT nasal spray One spray in each nostril twice a day, use left hand for right nostril, and right hand for left nostril.  Marland Kitchen psyllium (METAMUCIL) 58.6 % powder Take 1 packet by mouth as needed.  . Tdap (BOOSTRIX) 5-2.5-18.5 LF-MCG/0.5 injection Inject 0.5 mLs into the muscle once for 1 dose. Administer at pharmacy  .  TRAVATAN Z 0.004 % SOLN ophthalmic solution Place 1 drop into both eyes at bedtime.  Marland Kitchen. Zoster Vaccine Adjuvanted [facility](SHINGRIX) injection Inject 0.5 mLs into the muscle once for 1 dose. Needs second vaccine.  To be administered by pharmacy.   No facility-administered encounter medications on file as of 08/27/2018.     Activities of Daily Living No flowsheet data found.  Patient Care Team: Monica Bectonhekkekandam, Deatra Mcmahen J, MD as PCP - General (Family Medicine)   Assessment:   This is a routine wellness examination for Adam Hatfield.  Exercise Activities and Dietary recommendations: Attachment printed    Goals   None     Fall Risk No flowsheet data found. Is the patient's home free of loose throw rugs in walkways, pet beds, electrical cords, etc?   yes      Grab bars in the bathroom? no      Handrails on the  stairs?   yes      Adequate lighting?   yes  Timed Get Up and Go Performed: No needed  Depression Screen PHQ 2/9 Scores 08/17/2016  PHQ - 2 Score 0    Cognitive Function adequate     6CIT Screen 08/21/2017  What Year? 0 points  What month? 0 points  What time? 0 points  Count back from 20 0 points  Months in reverse 0 points  Repeat phrase 0 points  Total Score 0    Immunization History  Administered Date(s) Administered  . Influenza Split 03/13/2011  . Influenza, High Dose Seasonal PF 02/01/2018  . Influenza, Seasonal, Injecte, Preservative Fre 03/04/2012  . Influenza,inj,Quad PF,6+ Mos 02/17/2013, 02/13/2017  . Influenza-Unspecified 01/28/2014, 01/28/2016  . Pneumococcal Conjugate-13 08/21/2017  . Pneumococcal Polysaccharide-23 08/27/2018  . Tdap 07/13/2008  . Zoster 11/16/2015  . Zoster Recombinat (Shingrix) 05/09/2018    Qualifies for Shingles Vaccine? Due  Screening Tests Health Maintenance  Topic Date Due  . TETANUS/TDAP  07/14/2018  . PNA vac Low Risk Adult (2 of 2 - PPSV23) 08/22/2018  . INFLUENZA VACCINE  11/02/2018  . COLONOSCOPY  06/30/2024  . Hepatitis C Screening  Completed   Cancer Screenings: Lung: Low Dose CT Chest recommended if Age 81-80 years, 30 pack-year currently smoking OR have quit w/in 15years. Patient does not qualify. Colorectal: UTD     Plan:   Healthy male, see assessment and plan  I have personally reviewed and noted the following in the patient's chart:   . Medical and social history . Use of alcohol, tobacco or illicit drugs  . Current medications and supplements . Functional ability and status . Nutritional status . Physical activity . Advanced directives . List of other physicians . Hospitalizations, surgeries, and ER visits in previous 12 months . Vitals . Screenings to include cognitive, depression, and falls . Referrals and appointments  Annual physical exam Annual physical as above. Pneumococcal 23 today. He  is due for his second Zostavax, Tdap, prescriptions will be sent to his pharmacy. PSA was done this year, we will do a PSA every other year, with rectal exams in the years in between.  Hyperlipidemia LDL goal <70 LDL is a bit high, he will do a low-cholesterol diet, exercise more, we can recheck in 3 months.  Impingement syndrome of shoulder, left Restarting aggressive rehabilitation exercises, return in 6 weeks if needed.   In addition, I have reviewed and discussed with patient certain preventive protocols, quality metrics, and best practice recommendations. A written personalized care plan for preventive services as well as general preventive  health recommendations were provided to patient.     Rodney Langton, MD  08/27/2018

## 2018-08-27 NOTE — Assessment & Plan Note (Signed)
Annual physical as above. Pneumococcal 23 today. He is due for his second Zostavax, Tdap, prescriptions will be sent to his pharmacy. PSA was done this year, we will do a PSA every other year, with rectal exams in the years in between.

## 2018-08-27 NOTE — Patient Instructions (Signed)
Fat and Cholesterol Restricted Eating Plan  Eating a diet that limits fat and cholesterol may help lower your risk for heart disease and other conditions. Your body needs fat and cholesterol for basic functions, but eating too much of these things can be harmful to your health.  Your health care provider may order lab tests to check your blood fat (lipid) and cholesterol levels. This helps your health care provider understand your risk for certain conditions and whether you need to make diet changes. Work with your health care provider or dietitian to make an eating plan that is right for you.    What are tips for following this plan?  General guidelines    · If you are overweight, work with your health care provider to lose weight safely. Losing just 5-10% of your body weight can improve your overall health and help prevent diseases such as diabetes and heart disease.  · Avoid:  ? Foods with added sugar.  ? Fried foods.  ? Foods that contain partially hydrogenated oils, including stick margarine, some tub margarines, cookies, crackers, and other baked goods.  · Limit alcohol intake to no more than 1 drink a day for nonpregnant women and 2 drinks a day for men. One drink equals 12 oz of beer, 5 oz of wine, or 1½ oz of hard liquor.  Reading food labels  · Check food labels for:  ? Trans fats, partially hydrogenated oils, or high amounts of saturated fat. Avoid foods that contain saturated fat and trans fat.  ? The amount of cholesterol in each serving. Try to eat no more than 200 mg of cholesterol each day.  ? The amount of fiber in each serving. Try to eat at least 20-30 g of fiber each day.  · Choose foods with healthy fats, such as:  ? Monounsaturated and polyunsaturated fats. These include olive and canola oil, flaxseeds, walnuts, almonds, and seeds.  ? Omega-3 fats. These are found in foods such as salmon, mackerel, sardines, tuna, flaxseed oil, and ground flaxseeds.  · Choose grain products that have whole  grains. Look for the word "whole" as the first word in the ingredient list.  Cooking  · Cook foods using methods other than frying. Baking, boiling, grilling, and broiling are some healthy options.  · Eat more home-cooked food and less restaurant, buffet, and fast food.  · Avoid cooking using saturated fats.  ? Animal sources of saturated fats include meats, butter, and cream.  ? Plant sources of saturated fats include palm oil, palm kernel oil, and coconut oil.  Meal planning    · At meals, imagine dividing your plate into fourths:  ? Fill one-half of your plate with vegetables and green salads.  ? Fill one-fourth of your plate with whole grains.  ? Fill one-fourth of your plate with lean protein foods.  · Eat fish that is high in omega-3 fats at least two times a week.  · Eat more foods that contain fiber, such as whole grains, beans, apples, broccoli, carrots, peas, and barley. These foods help promote healthy cholesterol levels in the blood.  Recommended foods  Grains  · Whole grains, such as whole wheat or whole grain breads, crackers, cereals, and pasta. Unsweetened oatmeal, bulgur, barley, quinoa, or brown rice. Corn or whole wheat flour tortillas.  Vegetables  · Fresh or frozen vegetables (raw, steamed, roasted, or grilled). Green salads.  Fruits  · All fresh, canned (in natural juice), or frozen fruits.  Meats and other   protein foods  · Ground beef (85% or leaner), grass-fed beef, or beef trimmed of fat. Skinless chicken or turkey. Ground chicken or turkey. Pork trimmed of fat. All fish and seafood. Egg whites. Dried beans, peas, or lentils. Unsalted nuts or seeds. Unsalted canned beans. Natural nut butters without added sugar and oil.  Dairy  · Low-fat or nonfat dairy products, such as skim or 1% milk, 2% or reduced-fat cheeses, low-fat and fat-free ricotta or cottage cheese, or plain low-fat and nonfat yogurt.  Fats and oils  · Tub margarine without trans fats. Light or reduced-fat mayonnaise and salad  dressings. Avocado. Olive, canola, sesame, or safflower oils.  The items listed above may not be a complete list of recommended foods or beverages. Contact your dietitian for more options.  Foods to avoid  Grains  · White bread. White pasta. White rice. Cornbread. Bagels, pastries, and croissants. Crackers and snack foods that contain trans fat and hydrogenated oils.  Vegetables  · Vegetables cooked in cheese, cream, or butter sauce. Fried vegetables.  Fruits  · Canned fruit in heavy syrup. Fruit in cream or butter sauce. Fried fruit.  Meats and other protein foods  · Fatty cuts of meat. Ribs, chicken wings, bacon, sausage, bologna, salami, chitterlings, fatback, hot dogs, bratwurst, and packaged lunch meats. Liver and organ meats. Whole eggs and egg yolks. Chicken and turkey with skin. Fried meat.  Dairy  · Whole or 2% milk, cream, half-and-half, and cream cheese. Whole milk cheeses. Whole-fat or sweetened yogurt. Full-fat cheeses. Nondairy creamers and whipped toppings. Processed cheese, cheese spreads, and cheese curds.  Beverages  · Alcohol. Sugar-sweetened drinks such as sodas, lemonade, and fruit drinks.  Fats and oils  · Butter, stick margarine, lard, shortening, ghee, or bacon fat. Coconut, palm kernel, and palm oils.  Sweets and desserts  · Corn syrup, sugars, honey, and molasses. Candy. Jam and jelly. Syrup. Sweetened cereals. Cookies, pies, cakes, donuts, muffins, and ice cream.  The items listed above may not be a complete list of foods and beverages to avoid. Contact your dietitian for more information.  Summary  · Your body needs fat and cholesterol for basic functions. However, eating too much of these things can be harmful to your health.  · Work with your health care provider and dietitian to follow a diet low in fat and cholesterol. Doing this may help lower your risk for heart disease and other conditions.  · Choose healthy fats, such as monounsaturated and polyunsaturated fats, and foods high in  omega-3 fatty acids.  · Eat fiber-rich foods, such as whole grains, beans, peas, fruits, and vegetables.  · Limit or avoid alcohol, fried foods, and foods high in saturated fats, partially hydrogenated oils, and sugar.  This information is not intended to replace advice given to you by your health care provider. Make sure you discuss any questions you have with your health care provider.  Document Released: 03/20/2005 Document Revised: 12/05/2016 Document Reviewed: 12/05/2016  Elsevier Interactive Patient Education © 2019 Elsevier Inc.

## 2018-08-27 NOTE — Assessment & Plan Note (Signed)
LDL is a bit high, he will do a low-cholesterol diet, exercise more, we can recheck in 3 months.

## 2018-09-03 ENCOUNTER — Telehealth: Payer: Self-pay | Admitting: Sports Medicine

## 2018-09-03 NOTE — Telephone Encounter (Signed)
FYI: Pt left vm. He has had two doses of shingrix January 08, 2018 and May 08, 2018 at [place]..he confirmed dates with Pharmacist... Please update your records. Thanks

## 2018-09-04 NOTE — Telephone Encounter (Signed)
error 

## 2018-09-19 NOTE — Telephone Encounter (Signed)
Updated in chart

## 2019-04-07 ENCOUNTER — Telehealth: Payer: Self-pay

## 2019-04-07 NOTE — Telephone Encounter (Signed)
Pt was seen at [facility] on 04/02/2019 and tested positive for COVID-19. Pt was instructed to contact his PCP. Pt has been quarantining in the basement at home. He states his sx have improved some  ShOB: No Body Aches: Yes Headache: Yes Chills: Yes Weakness/Fatigue: No Temp: Yes, highest being 99.0 earlier today Cough: Yes, non-productive N/V/D: No Loss of taste/smell: No Decreased appetite: No Ear pain/pressure: No Sinus pain/pressure: No Runny nose: No Post nasal drainage: No Sore throat: No  Pt is wanting to know if Dr. Karie Schwalbe has any further recommendations regarding treatment of sx, as well as when he needs to be retested.

## 2019-04-07 NOTE — Telephone Encounter (Signed)
No changes, over-the-counter analgesics, quarantine for 14 days, no retesting needed, call me if he develops respiratory symptoms other than cough.

## 2019-04-08 NOTE — Telephone Encounter (Signed)
Patient aware of recommendations.  

## 2019-04-16 DIAGNOSIS — Z03818 Encounter for observation for suspected exposure to other biological agents ruled out: Secondary | ICD-10-CM | POA: Diagnosis not present

## 2019-04-16 DIAGNOSIS — Z20828 Contact with and (suspected) exposure to other viral communicable diseases: Secondary | ICD-10-CM | POA: Diagnosis not present

## 2019-04-18 ENCOUNTER — Telehealth: Payer: Self-pay

## 2019-04-18 NOTE — Telephone Encounter (Signed)
Pt left a vm msg requesting feed back from provider. Pt wants to know whether he would benefit in getting the Covid vaccine. As per pt, he tested positive for Covid in December. At time of illness, he followed the plan of care instructed by provider during that time. Pt did re-test recently and the results were negative. Can he get the Covid vaccine? Pls advise, thanks.

## 2019-04-18 NOTE — Telephone Encounter (Signed)
He can and absolutely should.

## 2019-04-21 NOTE — Telephone Encounter (Signed)
Pt has been updated and advise of provider's recommendation. Pt was informed of web site and vaccine procedure. No other inquiries during call.

## 2019-05-15 ENCOUNTER — Ambulatory Visit: Payer: Medicare Other

## 2019-06-24 DIAGNOSIS — H2513 Age-related nuclear cataract, bilateral: Secondary | ICD-10-CM | POA: Diagnosis not present

## 2019-06-24 DIAGNOSIS — H401131 Primary open-angle glaucoma, bilateral, mild stage: Secondary | ICD-10-CM | POA: Diagnosis not present

## 2019-08-11 ENCOUNTER — Telehealth: Payer: Self-pay | Admitting: *Deleted

## 2019-08-11 DIAGNOSIS — N400 Enlarged prostate without lower urinary tract symptoms: Secondary | ICD-10-CM

## 2019-08-11 DIAGNOSIS — I2510 Atherosclerotic heart disease of native coronary artery without angina pectoris: Secondary | ICD-10-CM

## 2019-08-11 DIAGNOSIS — E785 Hyperlipidemia, unspecified: Secondary | ICD-10-CM

## 2019-08-11 NOTE — Telephone Encounter (Signed)
Pt's wife notified of lab orders

## 2019-08-11 NOTE — Telephone Encounter (Signed)
Thank you for pending those labs.

## 2019-08-11 NOTE — Telephone Encounter (Signed)
Pt left vm wanting to do his lab work before his CPE with you on the 27th.  He wanted to make sure a PSA was added.

## 2019-08-19 DIAGNOSIS — N400 Enlarged prostate without lower urinary tract symptoms: Secondary | ICD-10-CM | POA: Diagnosis not present

## 2019-08-19 DIAGNOSIS — E785 Hyperlipidemia, unspecified: Secondary | ICD-10-CM | POA: Diagnosis not present

## 2019-08-19 DIAGNOSIS — I2510 Atherosclerotic heart disease of native coronary artery without angina pectoris: Secondary | ICD-10-CM | POA: Diagnosis not present

## 2019-08-19 DIAGNOSIS — I2584 Coronary atherosclerosis due to calcified coronary lesion: Secondary | ICD-10-CM | POA: Diagnosis not present

## 2019-08-20 LAB — COMPLETE METABOLIC PANEL WITH GFR
AG Ratio: 1.7 (calc) (ref 1.0–2.5)
ALT: 26 U/L (ref 9–46)
AST: 17 U/L (ref 10–35)
Albumin: 4.1 g/dL (ref 3.6–5.1)
Alkaline phosphatase (APISO): 53 U/L (ref 35–144)
BUN: 13 mg/dL (ref 7–25)
CO2: 28 mmol/L (ref 20–32)
Calcium: 9.2 mg/dL (ref 8.6–10.3)
Chloride: 104 mmol/L (ref 98–110)
Creat: 1.07 mg/dL (ref 0.70–1.25)
GFR, Est African American: 83 mL/min/{1.73_m2} (ref >=60)
GFR, Est Non African American: 71 mL/min/{1.73_m2} (ref >=60)
Globulin: 2.4 g/dL (calc) (ref 1.9–3.7)
Glucose, Bld: 113 mg/dL — ABNORMAL HIGH (ref 65–99)
Potassium: 4.1 mmol/L (ref 3.5–5.3)
Sodium: 138 mmol/L (ref 135–146)
Total Bilirubin: 0.4 mg/dL (ref 0.2–1.2)
Total Protein: 6.5 g/dL (ref 6.1–8.1)

## 2019-08-20 LAB — LIPID PANEL W/REFLEX DIRECT LDL
Cholesterol: 169 mg/dL (ref <200)
HDL: 41 mg/dL (ref >=40)
LDL Cholesterol (Calc): 110 mg/dL (calc) — ABNORMAL HIGH
Non-HDL Cholesterol (Calc): 128 mg/dL (calc) (ref <130)
Total CHOL/HDL Ratio: 4.1 (calc) (ref <5.0)
Triglycerides: 87 mg/dL (ref <150)

## 2019-08-20 LAB — CBC
HCT: 43.5 % (ref 38.5–50.0)
Hemoglobin: 14.8 g/dL (ref 13.2–17.1)
MCH: 34.3 pg — ABNORMAL HIGH (ref 27.0–33.0)
MCHC: 34 g/dL (ref 32.0–36.0)
MCV: 100.7 fL — ABNORMAL HIGH (ref 80.0–100.0)
MPV: 10.6 fL (ref 7.5–12.5)
Platelets: 354 10*3/uL (ref 140–400)
RBC: 4.32 10*6/uL (ref 4.20–5.80)
RDW: 14 % (ref 11.0–15.0)
WBC: 3.7 10*3/uL — ABNORMAL LOW (ref 3.8–10.8)

## 2019-08-20 LAB — PSA: PSA: 0.4 ng/mL (ref <=4.0)

## 2019-08-20 LAB — TSH: TSH: 2.08 mIU/L (ref 0.40–4.50)

## 2019-08-28 ENCOUNTER — Other Ambulatory Visit: Payer: Self-pay

## 2019-08-28 ENCOUNTER — Encounter: Payer: Self-pay | Admitting: Sports Medicine

## 2019-08-28 ENCOUNTER — Ambulatory Visit (INDEPENDENT_AMBULATORY_CARE_PROVIDER_SITE_OTHER): Payer: Medicare PPO | Admitting: Sports Medicine

## 2019-08-28 ENCOUNTER — Ambulatory Visit (INDEPENDENT_AMBULATORY_CARE_PROVIDER_SITE_OTHER): Payer: Medicare PPO

## 2019-08-28 VITALS — BP 108/71 | HR 96 | Ht 71.0 in | Wt 187.0 lb

## 2019-08-28 DIAGNOSIS — Z0000 Encounter for general adult medical examination without abnormal findings: Secondary | ICD-10-CM

## 2019-08-28 DIAGNOSIS — M51369 Other intervertebral disc degeneration, lumbar region without mention of lumbar back pain or lower extremity pain: Secondary | ICD-10-CM | POA: Insufficient documentation

## 2019-08-28 DIAGNOSIS — M5136 Other intervertebral disc degeneration, lumbar region: Secondary | ICD-10-CM

## 2019-08-28 DIAGNOSIS — M545 Low back pain: Secondary | ICD-10-CM | POA: Diagnosis not present

## 2019-08-28 MED ORDER — TETANUS-DIPHTH-ACELL PERTUSSIS 5-2.5-18.5 LF-MCG/0.5 IM SUSP: 0.5000 mL | Freq: Once | INTRAMUSCULAR | 0 refills | Status: AC

## 2019-08-28 NOTE — Assessment & Plan Note (Signed)
Routine physical as above, patient states that his insurance company covers regular physical exams rather than Medicare physicals.   Labs look good, he is due for a Tdap, sending to pharmacy.

## 2019-08-28 NOTE — Assessment & Plan Note (Signed)
Adam Hatfield has occasional low back pain, localized on the left side worse with sitting, flexion, Valsalva, it also occasionally radiates down his right leg. No red flags, adding x-rays, herniated disc rehab exercises, return to see me if no better in 4 to 6 weeks.

## 2019-08-28 NOTE — Progress Notes (Signed)
Subjective:    CC: Annual Physical Exam  HPI:  This patient is here for their annual physical  I reviewed the past medical history, family should still she still amputate history, social history, surgical history, and allergies today and no changes were needed.  Please see the problem list section below in epic for further details.  Past Medical History: Past Medical History:  Diagnosis Date  . Hyperlipidemia    Past Surgical History: Past Surgical History:  Procedure Laterality Date  . HEMORRHOID SURGERY  [id number]   Social History: Social History   Socioeconomic History  . Marital status: Married    Spouse name: Not on file  . Number of children: Not on file  . Years of education: Not on file  . Highest education level: Not on file  Occupational History  . Not on file  Tobacco Use  . Smoking status: Former Smoker    Quit date: 04/03/2010    Years since quitting: 9.4  . Smokeless tobacco: Never Used  Substance and Sexual Activity  . Alcohol use: Yes  . Drug use: No  . Sexual activity: Yes    Birth control/protection: None  Other Topics Concern  . Not on file  Social History Narrative  . Not on file   Social Determinants of Health   Financial Resource Strain:   . Difficulty of Paying Living Expenses:   Food Insecurity:   . Worried About Programme researcher, broadcasting/film/video in the Last Year:   . Barista in the Last Year:   Transportation Needs:   . Freight forwarder (Medical):   Marland Kitchen Lack of Transportation (Non-Medical):   Physical Activity:   . Days of Exercise per Week:   . Minutes of Exercise per Session:   Stress:   . Feeling of Stress :   Social Connections:   . Frequency of Communication with Friends and Family:   . Frequency of Social Gatherings with Friends and Family:   . Attends Religious Services:   . Active Member of Clubs or Organizations:   . Attends Banker Meetings:   Marland Kitchen Marital Status:    Family History: Family History  Problem  Relation Age of Onset  . Heart disease Mother   . Stroke Mother    Allergies: Allergies  Allergen Reactions  . Amoxicillin Swelling  . Biaxin [Clarithromycin] Swelling   Medications: See med rec.  Review of Systems: No headache, visual changes, nausea, vomiting, diarrhea, constipation, dizziness, abdominal pain, skin rash, fevers, chills, night sweats, swollen lymph nodes, weight loss, chest pain, body aches, joint swelling, muscle aches, shortness of breath, mood changes, visual or auditory hallucinations.  Objective:    General: Well Developed, well nourished, and in no acute distress.  Neuro: Alert and oriented x3, extra-ocular muscles intact, sensation grossly intact. Cranial nerves II through XII are intact, motor, sensory, and coordinative functions are all intact. HEENT: Normocephalic, atraumatic, pupils equal round reactive to light, neck supple, no masses, no lymphadenopathy, thyroid nonpalpable. Oropharynx, nasopharynx, external ear canals are unremarkable. Skin: Warm and dry, no rashes noted.  Cardiac: Regular rate and rhythm, no murmurs rubs or gallops.  Respiratory: Clear to auscultation bilaterally. Not using accessory muscles, speaking in full sentences.  Abdominal: Soft, nontender, nondistended, positive bowel sounds, no masses, no organomegaly.  Musculoskeletal: Shoulder, elbow, wrist, hip, knee, ankle stable, and with full range of motion.  Impression and Recommendations:    The patient was counselled, risk factors were discussed, anticipatory guidance given.  Annual  physical exam Routine physical as above, patient states that his insurance company covers regular physical exams rather than Medicare physicals.   Labs look good, he is due for a Tdap, sending to pharmacy.  Lumbar degenerative disc disease Roderic Scarce has occasional low back pain, localized on the left side worse with sitting, flexion, Valsalva, it also occasionally radiates down his right leg. No red  flags, adding x-rays, herniated disc rehab exercises, return to see me if no better in 4 to 6 weeks.   ___________________________________________ Gwen Her. Dianah Field, M.D., ABFM., CAQSM. Primary Care and Sports Medicine [facility] [facility]: [facility]  Adjunct Professor of [place] [employer] of Medicine

## 2019-09-16 ENCOUNTER — Other Ambulatory Visit: Payer: Self-pay | Admitting: Sports Medicine

## 2019-09-16 MED ORDER — FLUTICASONE PROPIONATE 50 MCG/ACT NA SUSP: NASAL | 3 refills | Status: DC

## 2020-01-13 DIAGNOSIS — H2513 Age-related nuclear cataract, bilateral: Secondary | ICD-10-CM | POA: Diagnosis not present

## 2020-01-13 DIAGNOSIS — H401131 Primary open-angle glaucoma, bilateral, mild stage: Secondary | ICD-10-CM | POA: Diagnosis not present

## 2020-02-20 ENCOUNTER — Ambulatory Visit (INDEPENDENT_AMBULATORY_CARE_PROVIDER_SITE_OTHER): Payer: Medicare PPO

## 2020-02-20 ENCOUNTER — Other Ambulatory Visit: Payer: Self-pay

## 2020-02-20 ENCOUNTER — Ambulatory Visit (INDEPENDENT_AMBULATORY_CARE_PROVIDER_SITE_OTHER): Payer: Medicare PPO | Admitting: Sports Medicine

## 2020-02-20 DIAGNOSIS — N529 Male erectile dysfunction, unspecified: Secondary | ICD-10-CM | POA: Diagnosis not present

## 2020-02-20 DIAGNOSIS — M25511 Pain in right shoulder: Secondary | ICD-10-CM

## 2020-02-20 DIAGNOSIS — M7541 Impingement syndrome of right shoulder: Secondary | ICD-10-CM

## 2020-02-20 MED ORDER — MELOXICAM 15 MG PO TABS: ORAL_TABLET | ORAL | 3 refills | Status: DC

## 2020-02-20 MED ORDER — TADALAFIL 5 MG PO TABS: 5.0000 mg | ORAL_TABLET | Freq: Every day | ORAL | 11 refills | Status: DC

## 2020-02-20 NOTE — Assessment & Plan Note (Signed)
Starting Cialis 5, he will download a good Rx coupon, return to see me in 6 weeks, we can dose titrate as needed, he does have BPH so this will be helpful as well.

## 2020-02-20 NOTE — Progress Notes (Signed)
    Procedures performed today:    None.  Independent interpretation of notes and tests performed by another provider:   None.  Brief History, Exam, Impression, and Recommendations:    Impingement syndrome of right shoulder Impingement syndrome of the right shoulder, worse with golfing. No weakness to suggest a cuff tear, x-rays, home rehab exercises, meloxicam per Return in 6 weeks, injection if no better. We went over the anatomy today.  Erectile dysfunction Starting Cialis 5, he will download a good Rx coupon, return to see me in 6 weeks, we can dose titrate as needed, he does have BPH so this will be helpful as well.    ___________________________________________ Ihor Austin. Benjamin Stain, M.D., ABFM., CAQSM. Primary Care and Sports Medicine [place] [facility]  Adjunct Instructor of Family Medicine  [employer]

## 2020-02-20 NOTE — Assessment & Plan Note (Signed)
Impingement syndrome of the right shoulder, worse with golfing. No weakness to suggest a cuff tear, x-rays, home rehab exercises, meloxicam per Return in 6 weeks, injection if no better. We went over the anatomy today.

## 2020-04-01 ENCOUNTER — Other Ambulatory Visit: Payer: Self-pay

## 2020-04-01 ENCOUNTER — Ambulatory Visit (INDEPENDENT_AMBULATORY_CARE_PROVIDER_SITE_OTHER): Payer: Medicare PPO | Admitting: Sports Medicine

## 2020-04-01 DIAGNOSIS — N529 Male erectile dysfunction, unspecified: Secondary | ICD-10-CM | POA: Diagnosis not present

## 2020-04-01 DIAGNOSIS — M7541 Impingement syndrome of right shoulder: Secondary | ICD-10-CM | POA: Diagnosis not present

## 2020-04-01 NOTE — Progress Notes (Signed)
    Procedures performed today:    None.  Independent interpretation of notes and tests performed by another provider:   None.  Brief History, Exam, Impression, and Recommendations:    Erectile dysfunction Has not yet tried the Cialis, I did advise him to do it daily so that he is locked and loaded when needed. We also discussed that this would help BPH.  Impingement syndrome of right shoulder X-rays unrevealing, home therapy has improved his symptoms considerably. He will do 1 set of his rehab exercises prior to golf to warm up. Continue meloxicam as needed and on days before he goes and plays golf with food. Return to see me in 6 weeks, injection if no better.    ___________________________________________ Ihor Austin. Benjamin Stain, M.D., ABFM., CAQSM. Primary Care and Sports Medicine [place] [facility]  Adjunct Instructor of Family Medicine  [employer]

## 2020-04-01 NOTE — Assessment & Plan Note (Signed)
X-rays unrevealing, home therapy has improved his symptoms considerably. He will do 1 set of his rehab exercises prior to golf to warm up. Continue meloxicam as needed and on days before he goes and plays golf with food. Return to see me in 6 weeks, injection if no better.

## 2020-04-01 NOTE — Assessment & Plan Note (Signed)
Has not yet tried the Cialis, I did advise him to do it daily so that he is locked and loaded when needed. We also discussed that this would help BPH.

## 2020-04-29 DIAGNOSIS — Z135 Encounter for screening for eye and ear disorders: Secondary | ICD-10-CM | POA: Diagnosis not present

## 2020-04-29 DIAGNOSIS — H401130 Primary open-angle glaucoma, bilateral, stage unspecified: Secondary | ICD-10-CM | POA: Diagnosis not present

## 2020-04-29 DIAGNOSIS — D3131 Benign neoplasm of right choroid: Secondary | ICD-10-CM | POA: Diagnosis not present

## 2020-04-29 DIAGNOSIS — H524 Presbyopia: Secondary | ICD-10-CM | POA: Diagnosis not present

## 2020-04-29 DIAGNOSIS — H25813 Combined forms of age-related cataract, bilateral: Secondary | ICD-10-CM | POA: Diagnosis not present

## 2020-04-29 DIAGNOSIS — H5203 Hypermetropia, bilateral: Secondary | ICD-10-CM | POA: Diagnosis not present

## 2020-04-29 DIAGNOSIS — H52223 Regular astigmatism, bilateral: Secondary | ICD-10-CM | POA: Diagnosis not present

## 2020-07-20 DIAGNOSIS — H401131 Primary open-angle glaucoma, bilateral, mild stage: Secondary | ICD-10-CM | POA: Diagnosis not present

## 2020-07-20 DIAGNOSIS — H2513 Age-related nuclear cataract, bilateral: Secondary | ICD-10-CM | POA: Diagnosis not present

## 2021-01-13 ENCOUNTER — Other Ambulatory Visit: Payer: Self-pay

## 2021-01-13 MED ORDER — ZOSTER VAC RECOMB ADJUVANTED 50 MCG/0.5ML IM SUSR: 0.5000 mL | Freq: Once | INTRAMUSCULAR | 0 refills | Status: AC

## 2021-01-25 DIAGNOSIS — H401131 Primary open-angle glaucoma, bilateral, mild stage: Secondary | ICD-10-CM | POA: Diagnosis not present

## 2021-01-25 DIAGNOSIS — H2513 Age-related nuclear cataract, bilateral: Secondary | ICD-10-CM | POA: Diagnosis not present

## 2021-02-04 ENCOUNTER — Ambulatory Visit (INDEPENDENT_AMBULATORY_CARE_PROVIDER_SITE_OTHER): Payer: Medicare PPO | Admitting: Sports Medicine

## 2021-02-04 ENCOUNTER — Other Ambulatory Visit: Payer: Self-pay

## 2021-02-04 VITALS — BP 120/80 | HR 71 | Ht 71.5 in | Wt 196.0 lb

## 2021-02-04 DIAGNOSIS — Z87891 Personal history of nicotine dependence: Secondary | ICD-10-CM | POA: Diagnosis not present

## 2021-02-04 DIAGNOSIS — N529 Male erectile dysfunction, unspecified: Secondary | ICD-10-CM

## 2021-02-04 DIAGNOSIS — Z0000 Encounter for general adult medical examination without abnormal findings: Secondary | ICD-10-CM | POA: Diagnosis not present

## 2021-02-04 DIAGNOSIS — Z122 Encounter for screening for malignant neoplasm of respiratory organs: Secondary | ICD-10-CM

## 2021-02-04 MED ORDER — TADALAFIL 5 MG PO TABS: 5.0000 mg | ORAL_TABLET | Freq: Every day | ORAL | 0 refills | Status: DC

## 2021-02-04 NOTE — Patient Instructions (Addendum)
MEDICARE ANNUAL WELLNESS VISIT Health Maintenance Summary and Written Plan of Care  [name] ,  Thank you for allowing me to perform your Medicare Annual Wellness Visit and for your ongoing commitment to your health.   Health Maintenance & Immunization History Health Maintenance  Topic Date Due   COLONOSCOPY (Pts 45-33yrs Insurance coverage will need to be confirmed)  06/30/2024   TETANUS/TDAP  09/23/2029   Pneumonia Vaccine 27+ Years old  Completed   INFLUENZA VACCINE  Completed   COVID-19 Vaccine  Completed   Hepatitis C Screening  Completed   Zoster Vaccines- Shingrix  Completed   HPV VACCINES  Aged Out   Immunization History  Administered Date(s) Administered   Influenza Split 03/13/2011   Influenza, High Dose Seasonal PF 02/01/2018   Influenza, Seasonal, Injecte, Preservative Fre 03/04/2012   Influenza,inj,Quad PF,6+ Mos 02/17/2013, 02/13/2017   Influenza-Unspecified 01/28/2014, 01/28/2016, 01/04/2021   PFIZER(Purple Top)SARS-COV-2 Vaccination 04/26/2019, 05/17/2019, 12/30/2019, 08/02/2020, 02/01/2021   Pneumococcal Conjugate-13 08/21/2017   Pneumococcal Polysaccharide-23 08/27/2018   Tdap 07/13/2008, 09/24/2019   Zoster Recombinat (Shingrix) 01/08/2018, 05/09/2018   Zoster, Live 11/16/2015, 01/08/2018    These are the patient goals that we discussed:  Goals Addressed               This Visit's Progress     Patient Stated (pt-stated)        02/04/2021 AWV Goal: Exercise for General Health  Patient will verbalize understanding of the benefits of increased physical activity: Exercising regularly is important. It will improve your overall fitness, flexibility, and endurance. Regular exercise also will improve your overall health. It can help you control your weight, reduce stress, and improve your bone density. Over the next year, patient will increase physical activity as tolerated with a goal of at least 150 minutes of moderate physical activity per week.  You  can tell that you are exercising at a moderate intensity if your heart starts beating faster and you start breathing faster but can still hold a conversation. Moderate-intensity exercise ideas include: Walking 1 mile (1.6 km) in about 15 minutes Biking Hiking Golfing Dancing Water aerobics Patient will verbalize understanding of everyday activities that increase physical activity by providing examples like the following: Yard work, such as: Insurance underwriter Gardening Washing windows or floors Patient will be able to explain general safety guidelines for exercising:  Before you start a new exercise program, talk with your health care provider. Do not exercise so much that you hurt yourself, feel dizzy, or get very short of breath. Wear comfortable clothes and wear shoes with good support. Drink plenty of water while you exercise to prevent dehydration or heat stroke. Work out until your breathing and your heartbeat get faster.          This is a list of Health Maintenance Items that are overdue or due now: Lung Cancer Screening    Orders/Referrals Placed Today: Orders Placed This Encounter  Procedures   Ambulatory Referral for Lung Cancer Scre    Referral Priority:   Routine    Referral Type:   Consultation    Referral Reason:   Specialty Services Required    Number of Visits Requested:   1    (Contact our referral department at [phone] if you have not spoken with someone about your referral appointment within the next 5 days)    Follow-up Plan Follow-up with Monica Becton, MD as planned  Medicare wellness in one year.  AVS printed and given to the patient.      Health Maintenance, Male Adopting a healthy lifestyle and getting preventive care are important in promoting health and wellness. Ask your health care provider about: The right schedule for you to have regular  tests and exams. Things you can do on your own to prevent diseases and keep yourself healthy. What should I know about diet, weight, and exercise? Eat a healthy diet  Eat a diet that includes plenty of vegetables, fruits, low-fat dairy products, and lean protein. Do not eat a lot of foods that are high in solid fats, added sugars, or sodium. Maintain a healthy weight Body mass index (BMI) is a measurement that can be used to identify possible weight problems. It estimates body fat based on height and weight. Your health care provider can help determine your BMI and help you achieve or maintain a healthy weight. Get regular exercise Get regular exercise. This is one of the most important things you can do for your health. Most adults should: Exercise for at least 150 minutes each week. The exercise should increase your heart rate and make you sweat (moderate-intensity exercise). Do strengthening exercises at least twice a week. This is in addition to the moderate-intensity exercise. Spend less time sitting. Even light physical activity can be beneficial. Watch cholesterol and blood lipids Have your blood tested for lipids and cholesterol at 68 years of age, then have this test every 5 years. You may need to have your cholesterol levels checked more often if: Your lipid or cholesterol levels are high. You are older than 68 years of age. You are at high risk for heart disease. What should I know about cancer screening? Many types of cancers can be detected early and may often be prevented. Depending on your health history and family history, you may need to have cancer screening at various ages. This may include screening for: Colorectal cancer. Prostate cancer. Skin cancer. Lung cancer. What should I know about heart disease, diabetes, and high blood pressure? Blood pressure and heart disease High blood pressure causes heart disease and increases the risk of stroke. This is more likely to  develop in people who have high blood pressure readings or are overweight. Talk with your health care provider about your target blood pressure readings. Have your blood pressure checked: Every 3-5 years if you are 49-21 years of age. Every year if you are 46 years old or older. If you are between the ages of 54 and 3 and are a current or former smoker, ask your health care provider if you should have a one-time screening for abdominal aortic aneurysm (AAA). Diabetes Have regular diabetes screenings. This checks your fasting blood sugar level. Have the screening done: Once every three years after age 76 if you are at a normal weight and have a low risk for diabetes. More often and at a younger age if you are overweight or have a high risk for diabetes. What should I know about preventing infection? Hepatitis B If you have a higher risk for hepatitis B, you should be screened for this virus. Talk with your health care provider to find out if you are at risk for hepatitis B infection. Hepatitis C Blood testing is recommended for: Everyone born from [birth year] through 1963. Anyone with known risk factors for hepatitis C. Sexually transmitted infections (STIs) You should be screened each year for STIs, including gonorrhea and chlamydia, if: You are sexually  active and are younger than 68 years of age. You are older than 68 years of age and your health care provider tells you that you are at risk for this type of infection. Your sexual activity has changed since you were last screened, and you are at increased risk for chlamydia or gonorrhea. Ask your health care provider if you are at risk. Ask your health care provider about whether you are at high risk for HIV. Your health care provider may recommend a prescription medicine to help prevent HIV infection. If you choose to take medicine to prevent HIV, you should first get tested for HIV. You should then be tested every 3 months for as long as you are  taking the medicine. Follow these instructions at home: Alcohol use Do not drink alcohol if your health care provider tells you not to drink. If you drink alcohol: Limit how much you have to 0-2 drinks a day. Know how much alcohol is in your drink. In the [place] one drink equals one 12 oz bottle of beer (355 mL), one 5 oz glass of wine (148 mL), or one 1 oz glass of hard liquor (44 mL). Lifestyle Do not use any products that contain nicotine or tobacco. These products include cigarettes, chewing tobacco, and vaping devices, such as e-cigarettes. If you need help quitting, ask your health care provider. Do not use street drugs. Do not share needles. Ask your health care provider for help if you need support or information about quitting drugs. General instructions Schedule regular health, dental, and eye exams. Stay current with your vaccines. Tell your health care provider if: You often feel depressed. You have ever been abused or do not feel safe at home. Summary Adopting a healthy lifestyle and getting preventive care are important in promoting health and wellness. Follow your health care provider's instructions about healthy diet, exercising, and getting tested or screened for diseases. Follow your health care provider's instructions on monitoring your cholesterol and blood pressure. This information is not intended to replace advice given to you by your health care provider. Make sure you discuss any questions you have with your health care provider. Document Revised: 08/09/2020 Document Reviewed: 08/09/2020 Elsevier Patient Education  2022 [employer].

## 2021-02-04 NOTE — Progress Notes (Signed)
MEDICARE ANNUAL WELLNESS VISIT  02/04/2021  Subjective:  Adam Hatfield is a 70 y.o. male patient of Thekkekandam, Ihor Austin, MD who had a Medicare Annual Wellness Visit today. Adam Hatfield is Retired and lives with their spouse. he has 0 children. he reports that he is socially active and does interact with friends/family regularly. he is moderately physically active and enjoys hunt, yard work and Systems analyst.  Patient Care Team: Monica Becton, MD as PCP - General (Family Medicine)  Advanced Directives 02/04/2021 11/02/2016  Does Patient Have a Medical Advance Directive? No No  Would patient like information on creating a medical advance directive? No - Patient declined No - Patient declined    Hospital Utilization Over the Past 12 Months: # of hospitalizations or ER visits: 0 # of surgeries: 0  Review of Systems    Patient reports that his overall health is unchanged when compared to last year.  Review of Systems: History obtained from chart review and the patient  All other systems negative.  Pain Assessment Pain : 0-10 Pain Score: 4  Pain Type: Acute pain Pain Location: Neck Pain Descriptors / Indicators: Sore Pain Onset: In the past 7 days Pain Frequency: Intermittent     Current Medications & Allergies (verified) Allergies as of 02/04/2021       Reactions   Amoxicillin Swelling   Biaxin [clarithromycin] Swelling        Medication List        Accurate as of February 04, 2021 10:54 AM. If you have any questions, ask your nurse or doctor.          meloxicam 15 MG tablet Commonly known as: MOBIC One tab PO qAM with a meal for 2 weeks, then daily prn pain.   psyllium 58.6 % powder Commonly known as: METAMUCIL Take 1 packet by mouth as needed.   tadalafil 5 MG tablet Commonly known as: CIALIS Take 1 tablet (5 mg total) by mouth daily.   Travatan Z 0.004 % Soln ophthalmic solution Generic drug: Travoprost (BAK Free) Place 1 drop into both eyes at  bedtime.        History (reviewed): Past Medical History:  Diagnosis Date   Glaucoma 2019   Hyperlipidemia    Past Surgical History:  Procedure Laterality Date   HEMORRHOID SURGERY  [id number]   Family History  Problem Relation Age of Onset   Heart disease Mother    Stroke Mother    Social History   Socioeconomic History   Marital status: Married    Spouse name: Alma   Number of children: Not on file   Years of education: 18   Highest education level: Master's degree (e.g., MA, MS, MEng, MEd, MSW, MBA)  Occupational History   Occupation: Retired  Tobacco Use   Smoking status: Former    Types: Cigarettes    Quit date: 04/03/2010    Years since quitting: 10.8   Smokeless tobacco: Never  Vaping Use   Vaping Use: Never used  Substance and Sexual Activity   Alcohol use: Yes    Alcohol/week: 4.0 standard drinks    Types: 4 Glasses of wine per week   Drug use: No   Sexual activity: Yes    Birth control/protection: None  Other Topics Concern   Not on file  Social History Narrative   Lives with his wife. He is retired and enjoys Naval architect, hunt and working in the yard.   Social Determinants of Health   Financial Resource Strain: Low Risk  Difficulty of Paying Living Expenses: Not hard at all  Food Insecurity: No Food Insecurity   Worried About Running Out of Food in the Last Year: Never true   Ran Out of Food in the Last Year: Never true  Transportation Needs: No Transportation Needs   Lack of Transportation (Medical): No   Lack of Transportation (Non-Medical): No  Physical Activity: Insufficiently Active   Days of Exercise per Week: 3 days   Minutes of Exercise per Session: 30 min  Stress: No Stress Concern Present   Feeling of Stress : Not at all  Social Connections: Socially Integrated   Frequency of Communication with Friends and Family: More than three times a week   Frequency of Social Gatherings with Friends and Family: Twice a week   Attends  Religious Services: More than 4 times per year   Active Member of [employer] or Organizations: Yes   Attends Banker Meetings: More than 4 times per year   Marital Status: Married    Activities of Daily Living In your present state of health, do you have any difficulty performing the following activities: 02/04/2021  Hearing? N  Vision? N  Comment has glaucoma and follows up with 6 month exam.s  Difficulty concentrating or making decisions? N  Walking or climbing stairs? N  Dressing or bathing? N  Doing errands, shopping? N  Preparing Food and eating ? N  Using the Toilet? N  In the past six months, have you accidently leaked urine? N  Do you have problems with loss of bowel control? N  Managing your Medications? N  Managing your Finances? N  Housekeeping or managing your Housekeeping? N  Some recent data might be hidden    Patient Education/Literacy How often do you need to have someone help you when you read instructions, pamphlets, or other written materials from your doctor or pharmacy?: 1 - Never What is the last grade level you completed in school?: Master's degree  Exercise Current Exercise Habits: Home exercise routine, Type of exercise: strength training/weights;walking, Time (Minutes): 30, Frequency (Times/Week): 3, Weekly Exercise (Minutes/Week): 90, Intensity: Moderate, Exercise limited by: None identified  Diet Patient reports consuming 3 meals a day and 2 snack(s) a day Patient reports that his primary diet is: Regular Patient reports that she does not have regular access to food.   Depression Screen PHQ 2/9 Scores 02/04/2021 04/01/2020 08/28/2019 08/17/2016  PHQ - 2 Score 0 0 0 0     Fall Risk Fall Risk  02/04/2021 04/01/2020  Falls in the past year? 0 0  Number falls in past yr: 0 0  Injury with Fall? 0 0  Risk for fall due to : No Fall Risks -  Follow up Falls evaluation completed -     Objective:   BP 120/80 (BP Location: Right Arm, Patient  Position: Sitting, Cuff Size: Normal)   Pulse 71   Ht 5' 11.5" (1.816 m)   Wt 196 lb 0.6 oz (88.9 kg)   SpO2 97%   BMI 26.96 kg/m   Last Weight  Most recent update: 02/04/2021 10:05 AM    Weight  88.9 kg (196 lb 0.6 oz)             Body mass index is 26.96 kg/m.  Hearing/Vision  Adam Hatfield did not have difficulty with hearing/understanding during the face-to-face interview Adam Hatfield did not have difficulty with his vision during the face-to-face interview Reports that he has had a formal eye exam by an eye care professional  within the past year Reports that he has not had a formal hearing evaluation within the past year  Cognitive Function: 6CIT Screen 02/04/2021 08/21/2017  What Year? 0 points 0 points  What month? 0 points 0 points  What time? 0 points 0 points  Count back from 20 0 points 0 points  Months in reverse 0 points 0 points  Repeat phrase 2 points 0 points  Total Score 2 0    Normal Cognitive Function Screening: Yes (Normal:0-7, Significant for Dysfunction: >8)  Immunization & Health Maintenance Record Immunization History  Administered Date(s) Administered   Influenza Split 03/13/2011   Influenza, High Dose Seasonal PF 02/01/2018   Influenza, Seasonal, Injecte, Preservative Fre 03/04/2012   Influenza,inj,Quad PF,6+ Mos 02/17/2013, 02/13/2017   Influenza-Unspecified 01/28/2014, 01/28/2016, 01/04/2021   PFIZER(Purple Top)SARS-COV-2 Vaccination 04/26/2019, 05/17/2019, 12/30/2019, 08/02/2020, 02/01/2021   Pneumococcal Conjugate-13 08/21/2017   Pneumococcal Polysaccharide-23 08/27/2018   Tdap 07/13/2008, 09/24/2019   Zoster Recombinat (Shingrix) 01/08/2018, 05/09/2018   Zoster, Live 11/16/2015, 01/08/2018    Health Maintenance  Topic Date Due   COLONOSCOPY (Pts 45-47yrs Insurance coverage will need to be confirmed)  06/30/2024   TETANUS/TDAP  09/23/2029   Pneumonia Vaccine 34+ Years old  Completed   INFLUENZA VACCINE  Completed   COVID-19 Vaccine   Completed   Hepatitis C Screening  Completed   Zoster Vaccines- Shingrix  Completed   HPV VACCINES  Aged Out       Assessment  This is a routine wellness examination for Adam Hatfield.  Health Maintenance: Due or Overdue There are no preventive care reminders to display for this patient.   Adam Hatfield does not need a referral for [employer] Assistance: Care Management:   no Social Work:    no Prescription Assistance:  no Nutrition/Diabetes Education:  no   Plan:  Personalized Goals  Goals Addressed               This Visit's Progress     Patient Stated (pt-stated)        02/04/2021 AWV Goal: Exercise for General Health  Patient will verbalize understanding of the benefits of increased physical activity: Exercising regularly is important. It will improve your overall fitness, flexibility, and endurance. Regular exercise also will improve your overall health. It can help you control your weight, reduce stress, and improve your bone density. Over the next year, patient will increase physical activity as tolerated with a goal of at least 150 minutes of moderate physical activity per week.  You can tell that you are exercising at a moderate intensity if your heart starts beating faster and you start breathing faster but can still hold a conversation. Moderate-intensity exercise ideas include: Walking 1 mile (1.6 km) in about 15 minutes Biking Hiking Golfing Dancing Water aerobics Patient will verbalize understanding of everyday activities that increase physical activity by providing examples like the following: Yard work, such as: Insurance underwriter Gardening Washing windows or floors Patient will be able to explain general safety guidelines for exercising:  Before you start a new exercise program, talk with your health care provider. Do not exercise so much that you hurt yourself,  feel dizzy, or get very short of breath. Wear comfortable clothes and wear shoes with good support. Drink plenty of water while you exercise to prevent dehydration or heat stroke. Work out until your breathing and your heartbeat get faster.        Personalized  Health Maintenance & Screening Recommendations  Lung Cancer Screening  Lung Cancer Screening Recommended: yes (Low Dose CT Chest recommended if Age 52-80 years, 30 pack-year currently smoking OR have quit w/in past 15 years) Hepatitis C Screening recommended: no HIV Screening recommended: no  Advanced Directives: Written information was not given per the patient's request.  Referrals & Orders Orders Placed This Encounter  Procedures   Ambulatory Referral for Lung Cancer Scre     Follow-up Plan Follow-up with Monica Becton, MD as planned Medicare wellness in one year.  AVS printed and given to the patient.   I have personally reviewed and noted the following in the patient's chart:   Medical and social history Use of alcohol, tobacco or illicit drugs  Current medications and supplements Functional ability and status Nutritional status Physical activity Advanced directives List of other physicians Hospitalizations, surgeries, and ER visits in previous 12 months Vitals Screenings to include cognitive, depression, and falls Referrals and appointments  In addition, I have reviewed and discussed with patient certain preventive protocols, quality metrics, and best practice recommendations. A written personalized care plan for preventive services as well as general preventive health recommendations were provided to patient.     Modesto Charon, RN  02/04/2021

## 2021-02-10 ENCOUNTER — Ambulatory Visit: Payer: Medicare PPO | Admitting: Sports Medicine

## 2021-02-11 ENCOUNTER — Telehealth: Payer: Self-pay

## 2021-02-11 NOTE — Telephone Encounter (Signed)
Medication: tadalafil (CIALIS) 5 MG tablet Prior authorization submitted via CoverMyMeds on 02/11/2021 [place] submission pending

## 2021-02-18 ENCOUNTER — Telehealth: Payer: Self-pay | Admitting: Sports Medicine

## 2021-02-18 DIAGNOSIS — E785 Hyperlipidemia, unspecified: Secondary | ICD-10-CM

## 2021-02-18 DIAGNOSIS — N400 Enlarged prostate without lower urinary tract symptoms: Secondary | ICD-10-CM

## 2021-02-18 NOTE — Telephone Encounter (Signed)
Pt scheduled his physical for December and would like to have his lab orders placed so he can have them completed before the Physical appt... Thank you

## 2021-02-19 NOTE — Telephone Encounter (Signed)
Orders placed.

## 2021-02-23 ENCOUNTER — Encounter: Payer: Self-pay | Admitting: Sports Medicine

## 2021-02-23 ENCOUNTER — Ambulatory Visit: Payer: Medicare PPO | Admitting: Sports Medicine

## 2021-02-23 ENCOUNTER — Other Ambulatory Visit: Payer: Self-pay

## 2021-02-23 DIAGNOSIS — N529 Male erectile dysfunction, unspecified: Secondary | ICD-10-CM | POA: Diagnosis not present

## 2021-02-23 DIAGNOSIS — H0011 Chalazion right upper eyelid: Secondary | ICD-10-CM | POA: Diagnosis not present

## 2021-02-23 DIAGNOSIS — M7541 Impingement syndrome of right shoulder: Secondary | ICD-10-CM

## 2021-02-23 MED ORDER — ERYTHROMYCIN 5 MG/GM OP OINT: 1.0000 "application " | TOPICAL_OINTMENT | Freq: Three times a day (TID) | OPHTHALMIC | 0 refills | Status: AC

## 2021-02-23 MED ORDER — TADALAFIL 5 MG PO TABS: 5.0000 mg | ORAL_TABLET | Freq: Every day | ORAL | 3 refills | Status: DC

## 2021-02-23 NOTE — Addendum Note (Signed)
Addended by: Carolin Coy on: 02/23/2021 09:10 AM   Modules accepted: Orders

## 2021-02-23 NOTE — Progress Notes (Signed)
    Procedures performed today:    None.  Independent interpretation of notes and tests performed by another provider:   None.  Brief History, Exam, Impression, and Recommendations:    Impingement syndrome of right shoulder Bilateral shoulder impingement syndrome, overall doing well with conditioning exercises, I gave him some pointers for avoiding impingement symptoms in the gym, he will continue the conditioning, good strength and good exam today, return as needed.  Erectile dysfunction Doing well with Cialis, refilling medication.  Chalazion right upper eyelid Right upper eyelid chalazion for over a week now, somewhat irritating and creating a conjunctivitis. He does have an appointment coming up with his ophthalmologist. We will add topical erythromycin in the meantime.  Adam Hatfield does have a physical coming up at which point we will catch him up on the rest of his preventive measures.  ___________________________________________ Adam Hatfield. Benjamin Stain, M.D., ABFM., CAQSM. Primary Care and Sports Medicine [facility]  Adjunct Instructor of Family Medicine  [employer]

## 2021-02-23 NOTE — Assessment & Plan Note (Signed)
Right upper eyelid chalazion for over a week now, somewhat irritating and creating a conjunctivitis. He does have an appointment coming up with his ophthalmologist. We will add topical erythromycin in the meantime.

## 2021-02-23 NOTE — Assessment & Plan Note (Signed)
Bilateral shoulder impingement syndrome, overall doing well with conditioning exercises, I gave him some pointers for avoiding impingement symptoms in the gym, he will continue the conditioning, good strength and good exam today, return as needed.

## 2021-02-23 NOTE — Assessment & Plan Note (Signed)
Doing well with Cialis, refilling medication.

## 2021-03-01 DIAGNOSIS — H0011 Chalazion right upper eyelid: Secondary | ICD-10-CM | POA: Diagnosis not present

## 2021-03-01 DIAGNOSIS — H2513 Age-related nuclear cataract, bilateral: Secondary | ICD-10-CM | POA: Diagnosis not present

## 2021-03-01 DIAGNOSIS — H401131 Primary open-angle glaucoma, bilateral, mild stage: Secondary | ICD-10-CM | POA: Diagnosis not present

## 2021-03-15 NOTE — Telephone Encounter (Signed)
Medication: tadalafil (CIALIS) 5 MG tablet Prior authorization determination received Medication has been denied Reason for denial:  "has not tried or cannot use a 5-alpha reductase inhibitor (for example: finasteride, dutasteride)"

## 2021-03-17 DIAGNOSIS — N400 Enlarged prostate without lower urinary tract symptoms: Secondary | ICD-10-CM | POA: Diagnosis not present

## 2021-03-17 DIAGNOSIS — E785 Hyperlipidemia, unspecified: Secondary | ICD-10-CM | POA: Diagnosis not present

## 2021-03-18 LAB — COMPREHENSIVE METABOLIC PANEL
AG Ratio: 1.7 (calc) (ref 1.0–2.5)
ALT: 23 U/L (ref 9–46)
AST: 21 U/L (ref 10–35)
Albumin: 4.1 g/dL (ref 3.6–5.1)
Alkaline phosphatase (APISO): 48 U/L (ref 35–144)
BUN: 15 mg/dL (ref 7–25)
CO2: 26 mmol/L (ref 20–32)
Calcium: 8.9 mg/dL (ref 8.6–10.3)
Chloride: 104 mmol/L (ref 98–110)
Creat: 1.05 mg/dL (ref 0.70–1.35)
Globulin: 2.4 g/dL (calc) (ref 1.9–3.7)
Glucose, Bld: 110 mg/dL — ABNORMAL HIGH (ref 65–99)
Potassium: 3.9 mmol/L (ref 3.5–5.3)
Sodium: 136 mmol/L (ref 135–146)
Total Bilirubin: 0.4 mg/dL (ref 0.2–1.2)
Total Protein: 6.5 g/dL (ref 6.1–8.1)

## 2021-03-18 LAB — CBC
HCT: 42.5 % (ref 38.5–50.0)
Hemoglobin: 14.6 g/dL (ref 13.2–17.1)
MCH: 34.7 pg — ABNORMAL HIGH (ref 27.0–33.0)
MCHC: 34.4 g/dL (ref 32.0–36.0)
MCV: 101 fL — ABNORMAL HIGH (ref 80.0–100.0)
MPV: 10.6 fL (ref 7.5–12.5)
Platelets: 270 10*3/uL (ref 140–400)
RBC: 4.21 10*6/uL (ref 4.20–5.80)
RDW: 13.9 % (ref 11.0–15.0)
WBC: 4.2 10*3/uL (ref 3.8–10.8)

## 2021-03-18 LAB — LIPID PANEL
Cholesterol: 200 mg/dL — ABNORMAL HIGH (ref <200)
HDL: 46 mg/dL (ref >=40)
LDL Cholesterol (Calc): 131 mg/dL (calc) — ABNORMAL HIGH
Non-HDL Cholesterol (Calc): 154 mg/dL (calc) — ABNORMAL HIGH (ref <130)
Total CHOL/HDL Ratio: 4.3 (calc) (ref <5.0)
Triglycerides: 120 mg/dL (ref <150)

## 2021-03-18 LAB — PSA, TOTAL AND FREE
PSA, % Free: 40 % (calc) (ref >25)
PSA, Free: 0.2 ng/mL
PSA, Total: 0.5 ng/mL (ref <=4.0)

## 2021-03-18 LAB — TSH: TSH: 1.45 mIU/L (ref 0.40–4.50)

## 2021-03-23 ENCOUNTER — Other Ambulatory Visit: Payer: Self-pay

## 2021-03-23 ENCOUNTER — Ambulatory Visit (INDEPENDENT_AMBULATORY_CARE_PROVIDER_SITE_OTHER): Payer: Medicare PPO

## 2021-03-23 ENCOUNTER — Ambulatory Visit (INDEPENDENT_AMBULATORY_CARE_PROVIDER_SITE_OTHER): Payer: Medicare PPO | Admitting: Sports Medicine

## 2021-03-23 ENCOUNTER — Encounter: Payer: Self-pay | Admitting: Sports Medicine

## 2021-03-23 VITALS — BP 122/75 | HR 90 | Ht 71.5 in | Wt 195.0 lb

## 2021-03-23 DIAGNOSIS — Z0000 Encounter for general adult medical examination without abnormal findings: Secondary | ICD-10-CM

## 2021-03-23 DIAGNOSIS — M4802 Spinal stenosis, cervical region: Secondary | ICD-10-CM | POA: Diagnosis not present

## 2021-03-23 DIAGNOSIS — R011 Cardiac murmur, unspecified: Secondary | ICD-10-CM | POA: Diagnosis not present

## 2021-03-23 DIAGNOSIS — M47816 Spondylosis without myelopathy or radiculopathy, lumbar region: Secondary | ICD-10-CM | POA: Diagnosis not present

## 2021-03-23 DIAGNOSIS — M47812 Spondylosis without myelopathy or radiculopathy, cervical region: Secondary | ICD-10-CM

## 2021-03-23 DIAGNOSIS — H0011 Chalazion right upper eyelid: Secondary | ICD-10-CM | POA: Diagnosis not present

## 2021-03-23 NOTE — Assessment & Plan Note (Signed)
We will start with some simple conditioning exercises, we discussed ergonomics at home, I would like some baseline cervical spine x-rays. Physical therapy has done wonders in the past so PT will be an option if he does not get better after a month and a half of conservative treatment.

## 2021-03-23 NOTE — Progress Notes (Signed)
Subjective:    CC: Annual Physical Exam  HPI:  This patient is here for their annual physical  I reviewed the past medical history, family history, social history, surgical history, and allergies today and no changes were needed.  Please see the problem list section below in epic for further details.  Past Medical History: Past Medical History:  Diagnosis Date   Glaucoma 2019   Hyperlipidemia    Past Surgical History: Past Surgical History:  Procedure Laterality Date   HEMORRHOID SURGERY  [id number]   Social History: Social History   Socioeconomic History   Marital status: Married    Spouse name: Adam Hatfield   Number of children: Not on file   Years of education: 18   Highest education level: Master's degree (e.g., MA, MS, MEng, MEd, MSW, MBA)  Occupational History   Occupation: Retired  Tobacco Use   Smoking status: Former    Types: Cigarettes    Quit date: 04/03/2010    Years since quitting: 10.9   Smokeless tobacco: Never  Vaping Use   Vaping Use: Never used  Substance and Sexual Activity   Alcohol use: Yes    Alcohol/week: 4.0 standard drinks    Types: 4 Glasses of wine per week   Drug use: No   Sexual activity: Yes    Birth control/protection: None  Other Topics Concern   Not on file  Social History Narrative   Lives with his wife. He is retired and enjoys Naval architect, hunt and working in the yard.   Social Determinants of Health   Financial Resource Strain: Low Risk    Difficulty of Paying Living Expenses: Not hard at all  Food Insecurity: No Food Insecurity   Worried About Programme researcher, broadcasting/film/video in the Last Year: Never true   Ran Out of Food in the Last Year: Never true  Transportation Needs: No Transportation Needs   Lack of Transportation (Medical): No   Lack of Transportation (Non-Medical): No  Physical Activity: Insufficiently Active   Days of Exercise per Week: 3 days   Minutes of Exercise per Session: 30 min  Stress: No Stress Concern Present    Feeling of Stress : Not at all  Social Connections: Socially Integrated   Frequency of Communication with Friends and Family: More than three times a week   Frequency of Social Gatherings with Friends and Family: Twice a week   Attends Religious Services: More than 4 times per year   Active Member of [employer] or Organizations: Yes   Attends Engineer, structural: More than 4 times per year   Marital Status: Married   Family History: Family History  Problem Relation Age of Onset   Heart disease Mother    Stroke Mother    Allergies: Allergies  Allergen Reactions   Amoxicillin Swelling   Biaxin [Clarithromycin] Swelling   Medications: See med rec.  Review of Systems: No headache, visual changes, nausea, vomiting, diarrhea, constipation, dizziness, abdominal pain, skin rash, fevers, chills, night sweats, swollen lymph nodes, weight loss, chest pain, body aches, joint swelling, muscle aches, shortness of breath, mood changes, visual or auditory hallucinations.  Objective:    General: Well Developed, well nourished, and in no acute distress.  Neuro: Alert and oriented x3, extra-ocular muscles intact, sensation grossly intact. Cranial nerves II through XII are intact, motor, sensory, and coordinative functions are all intact. HEENT: Normocephalic, atraumatic, pupils equal round reactive to light, neck supple, no masses, no lymphadenopathy, thyroid nonpalpable. Oropharynx, nasopharynx, external ear canals are  unremarkable. Skin: Warm and dry, no rashes noted.  Cardiac: Regular rate and rhythm, no rubs or gallops, there is a 1/6 systolic ejection murmur heard across the precordium, murmur improves with Valsalva. Respiratory: Clear to auscultation bilaterally. Not using accessory muscles, speaking in full sentences.  Abdominal: Soft, nontender, nondistended, positive bowel sounds, no masses, no organomegaly.  Musculoskeletal: Shoulder, elbow, wrist, hip, knee, ankle stable, and with full  range of motion.  Impression and Recommendations:    The patient was counselled, risk factors were discussed, anticipatory guidance given.  Annual physical exam Annual physical as above. We went over his labs, cholesterol could use some work, otherwise everything else looks good. Up-to-date on screening measures  Chalazion right upper eyelid Starting to improve slightly, he does have an ophthalmologist managing.  Cervical spondylosis We will start with some simple conditioning exercises, we discussed ergonomics at home, I would like some baseline cervical spine x-rays. Physical therapy has done wonders in the past so PT will be an option if he does not get better after a month and a half of conservative treatment.  Systolic murmur Adam Hatfield is asymptomatic but he does have a 1/6 systolic ejection murmur heard best across the precordium, improves with Valsalva. I think this is aortic stenosis, mild. We will go ahead and pull the trigger for an echocardiogram, we discussed indications for intervention, as well as symptomatology.   ___________________________________________ Ihor Austin. Benjamin Stain, M.D., ABFM., CAQSM. Primary Care and Sports Medicine [facility]  Adjunct Professor of Family Medicine  [employer] [facility] [employer]

## 2021-03-23 NOTE — Assessment & Plan Note (Signed)
Adam Hatfield is asymptomatic but he does have a 1/6 systolic ejection murmur heard best across the precordium, improves with Valsalva. I think this is aortic stenosis, mild. We will go ahead and pull the trigger for an echocardiogram, we discussed indications for intervention, as well as symptomatology.

## 2021-03-23 NOTE — Assessment & Plan Note (Signed)
Starting to improve slightly, he does have an ophthalmologist managing.

## 2021-03-23 NOTE — Assessment & Plan Note (Signed)
Annual physical as above. We went over his labs, cholesterol could use some work, otherwise everything else looks good. Up-to-date on screening measures

## 2021-04-08 ENCOUNTER — Telehealth: Payer: Self-pay

## 2021-04-08 NOTE — Telephone Encounter (Signed)
Medication: tadalafil (CIALIS) 5 MG tablet Prior authorization determination received Medication has been denied Reason for denial:  "has not tried or cannot use a 5-alpha reductase inhibitor (for example: finasteride, dutasteride)"   Appeal filed with [facility] 04/08/2021

## 2021-04-08 NOTE — Telephone Encounter (Signed)
Medication: tadalafil (CIALIS) 5 MG tablet Prior authorization submitted via CoverMyMeds on 04/08/2021 PA submission pending

## 2021-04-08 NOTE — Telephone Encounter (Deleted)
Medication: tadalafil (CIALIS) 5 MG tablet Appeal filed with [facility] 04/08/2021

## 2021-04-09 NOTE — Telephone Encounter (Signed)
Medication: tadalafil (CIALIS) 5 MG tablet Appeal filed 04/08/2021 Appeal determination received Medication has been approved Approval dates: 04/03/2021-04/02/2022  Patient aware via: MyChart Pharmacy aware: Yes Provider aware via this encounter

## 2021-04-26 ENCOUNTER — Other Ambulatory Visit: Payer: Self-pay

## 2021-04-26 ENCOUNTER — Ambulatory Visit (HOSPITAL_BASED_OUTPATIENT_CLINIC_OR_DEPARTMENT_OTHER)
Admission: RE | Admit: 2021-04-26 | Discharge: 2021-04-26 | Disposition: A | Discharge status: Home / Self Care | Payer: Medicare PPO | Source: Ambulatory Visit | Attending: Sports Medicine | Admitting: Sports Medicine

## 2021-04-26 DIAGNOSIS — R011 Cardiac murmur, unspecified: Secondary | ICD-10-CM | POA: Insufficient documentation

## 2021-04-26 LAB — ECHOCARDIOGRAM COMPLETE
AR max vel: 2.3 cm2
AV Area VTI: 2.59 cm2
AV Area mean vel: 2.09 cm2
AV Mean grad: 4 mmHg
AV Peak grad: 6.6 mmHg
Ao pk vel: 1.28 m/s
Area-P 1/2: 3.16 cm2
S' Lateral: 3.3 cm

## 2021-04-26 NOTE — Progress Notes (Signed)
°  Echocardiogram 2D Echocardiogram has been performed.  Roosvelt Maser F 04/26/2021, 10:49 AM

## 2021-08-01 DIAGNOSIS — H401131 Primary open-angle glaucoma, bilateral, mild stage: Secondary | ICD-10-CM | POA: Diagnosis not present

## 2022-01-13 ENCOUNTER — Ambulatory Visit: Payer: Medicare PPO | Admitting: Sports Medicine

## 2022-01-13 ENCOUNTER — Encounter: Payer: Self-pay | Admitting: Sports Medicine

## 2022-01-13 DIAGNOSIS — M51369 Other intervertebral disc degeneration, lumbar region without mention of lumbar back pain or lower extremity pain: Secondary | ICD-10-CM

## 2022-01-13 DIAGNOSIS — M5136 Other intervertebral disc degeneration, lumbar region: Secondary | ICD-10-CM | POA: Diagnosis not present

## 2022-01-13 DIAGNOSIS — Z87891 Personal history of nicotine dependence: Secondary | ICD-10-CM | POA: Diagnosis not present

## 2022-01-13 MED ORDER — CELECOXIB 200 MG PO CAPS
200.0000 mg | ORAL_CAPSULE | Freq: Every day | ORAL | 11 refills | Status: DC
Start: 2022-01-13 — End: 2022-11-07

## 2022-01-13 NOTE — Progress Notes (Signed)
    Procedures performed today:    None.  Independent interpretation of notes and tests performed by another provider:   None.  Brief History, Exam, Impression, and Recommendations:    Lumbar degenerative disc disease This is a very pleasant 71 year old male, chronic axial low back pain, he does get occasional left-sided radicular symptoms that goes to the back of the thigh but not past the knee. We treated him about 2 years ago, he did well with conservative treatment, now with recurrence of pain we will add Celebrex as he does have history of peptic ulcer disease, advanced herniated disc conditioning exercises, return to see me in 4 to 6 weeks if needed.  History of smoking We did recommend RSV  vaccination, he can get this through his employer or pharmacy.  Chronic process with exacerbation and pharmacologic intervention  ____________________________________________ Gwen Her. Dianah Field, M.D., ABFM., CAQSM., AME. Primary Care and Sports Medicine [facility]  Adjunct Professor of [place] [employer] [facility] of Medicine  Risk manager

## 2022-01-13 NOTE — Assessment & Plan Note (Signed)
We did recommend RSV  vaccination, he can get this through his employer or pharmacy.

## 2022-01-13 NOTE — Assessment & Plan Note (Signed)
This is a very pleasant 71 year old male, chronic axial low back pain, he does get occasional left-sided radicular symptoms that goes to the back of the thigh but not past the knee. We treated him about 2 years ago, he did well with conservative treatment, now with recurrence of pain we will add Celebrex as he does have history of peptic ulcer disease, advanced herniated disc conditioning exercises, return to see me in 4 to 6 weeks if needed.

## 2022-01-18 ENCOUNTER — Ambulatory Visit: Payer: Medicare PPO | Admitting: Sports Medicine

## 2022-01-25 ENCOUNTER — Telehealth: Payer: Self-pay | Admitting: Sports Medicine

## 2022-01-25 ENCOUNTER — Encounter: Payer: Self-pay | Admitting: Sports Medicine

## 2022-01-25 ENCOUNTER — Ambulatory Visit: Payer: Medicare PPO | Admitting: Sports Medicine

## 2022-01-25 VITALS — BP 114/74 | HR 86 | Ht 71.0 in | Wt 197.0 lb

## 2022-01-25 DIAGNOSIS — E538 Deficiency of other specified B group vitamins: Secondary | ICD-10-CM

## 2022-01-25 DIAGNOSIS — R361 Hematospermia: Secondary | ICD-10-CM

## 2022-01-25 DIAGNOSIS — D7589 Other specified diseases of blood and blood-forming organs: Secondary | ICD-10-CM | POA: Diagnosis not present

## 2022-01-25 NOTE — Telephone Encounter (Signed)
Patient called stating still seeing blood would like lab work to be sent and MRI at this time.

## 2022-01-25 NOTE — Assessment & Plan Note (Signed)
Persistence of hematospermia, adding labs, urinalyses, renal ultrasound, pelvic MRI.

## 2022-01-25 NOTE — Progress Notes (Addendum)
    Procedures performed today:    None.  Independent interpretation of notes and tests performed by another provider:   None.  Brief History, Exam, Impression, and Recommendations:    Hematospermia This is a very pleasant 71 year old male, approximately week and a half ago he noticed blood in his ejaculate, this has never happened before, he denies any fevers, chills, no pelvic pain, no dysuria, no penile discharge, no trauma. Ejaculation does not come often for him, and this was the first time in many months. I advised him that a single episode of hematospermia is really nothing to worry about if this recurs we would do the work-up consisting of lab work, coags, urinalysis and urine culture, ultrasonography of his urinary tract and MRI of his prostate. I also advised him that masturbation at least a couple times a week can make this less likely.  Macrocytosis without anemia Persistently elevated mean corpuscular volume (MCV), I would like to go ahead and add B12, folate levels as well as a pathology smear to the blood already in the lab.    ____________________________________________ Gwen Her. Dianah Field, M.D., ABFM., CAQSM., AME. Primary Care and Sports Medicine [facility]  Adjunct Professor of [place] [employer] [facility] of Medicine  Risk manager

## 2022-01-25 NOTE — Telephone Encounter (Signed)
Orders placed, he can come at his leisure for labs, no fasting needed, and needs to call imaging to schedule ultrasound and MRI.

## 2022-01-25 NOTE — Assessment & Plan Note (Signed)
This is a very pleasant 71 year old male, approximately week and a half ago he noticed blood in his ejaculate, this has never happened before, he denies any fevers, chills, no pelvic pain, no dysuria, no penile discharge, no trauma. Ejaculation does not come often for him, and this was the first time in many months. I advised him that a single episode of hematospermia is really nothing to worry about if this recurs we would do the work-up consisting of lab work, coags, urinalysis and urine culture, ultrasonography of his urinary tract and MRI of his prostate. I also advised him that masturbation at least a couple times a week can make this less likely.

## 2022-01-26 ENCOUNTER — Ambulatory Visit (INDEPENDENT_AMBULATORY_CARE_PROVIDER_SITE_OTHER): Payer: Medicare PPO

## 2022-01-26 DIAGNOSIS — K8020 Calculus of gallbladder without cholecystitis without obstruction: Secondary | ICD-10-CM | POA: Diagnosis not present

## 2022-01-26 DIAGNOSIS — Z125 Encounter for screening for malignant neoplasm of prostate: Secondary | ICD-10-CM | POA: Diagnosis not present

## 2022-01-26 DIAGNOSIS — N281 Cyst of kidney, acquired: Secondary | ICD-10-CM | POA: Diagnosis not present

## 2022-01-26 DIAGNOSIS — K8080 Other cholelithiasis without obstruction: Secondary | ICD-10-CM | POA: Diagnosis not present

## 2022-01-26 DIAGNOSIS — R361 Hematospermia: Secondary | ICD-10-CM

## 2022-01-26 NOTE — Telephone Encounter (Signed)
Patient called and notified of doctors directives.

## 2022-01-27 DIAGNOSIS — D7589 Other specified diseases of blood and blood-forming organs: Secondary | ICD-10-CM | POA: Insufficient documentation

## 2022-01-27 LAB — PSA, TOTAL AND FREE
PSA, % Free: 40 % (calc) (ref >25)
PSA, Free: 0.2 ng/mL
PSA, Total: 0.5 ng/mL (ref <=4.0)

## 2022-01-27 LAB — URINALYSIS W MICROSCOPIC + REFLEX CULTURE
Bacteria, UA: NONE SEEN /HPF
Bilirubin Urine: NEGATIVE
Glucose, UA: NEGATIVE
Hgb urine dipstick: NEGATIVE
Hyaline Cast: NONE SEEN /LPF
Ketones, ur: NEGATIVE
Leukocyte Esterase: NEGATIVE
Nitrites, Initial: NEGATIVE
Protein, ur: NEGATIVE
RBC / HPF: NONE SEEN /HPF (ref 0–2)
Specific Gravity, Urine: 1.024 (ref 1.001–1.035)
Squamous Epithelial / HPF: NONE SEEN /HPF (ref <=5)
WBC, UA: NONE SEEN /HPF (ref 0–5)
pH: <=5 (ref 5.0–8.0)

## 2022-01-27 LAB — COMPLETE METABOLIC PANEL WITH GFR
AG Ratio: 1.6 (calc) (ref 1.0–2.5)
ALT: 23 U/L (ref 9–46)
AST: 19 U/L (ref 10–35)
Albumin: 4.6 g/dL (ref 3.6–5.1)
Alkaline phosphatase (APISO): 51 U/L (ref 35–144)
BUN: 15 mg/dL (ref 7–25)
CO2: 28 mmol/L (ref 20–32)
Calcium: 9.5 mg/dL (ref 8.6–10.3)
Chloride: 104 mmol/L (ref 98–110)
Creat: 1.2 mg/dL (ref 0.70–1.35)
Globulin: 2.8 g/dL (calc) (ref 1.9–3.7)
Glucose, Bld: 124 mg/dL (ref 65–139)
Potassium: 4.3 mmol/L (ref 3.5–5.3)
Sodium: 138 mmol/L (ref 135–146)
Total Bilirubin: 0.6 mg/dL (ref 0.2–1.2)
Total Protein: 7.4 g/dL (ref 6.1–8.1)
eGFR: 65 mL/min/{1.73_m2} (ref >=60)

## 2022-01-27 LAB — NO CULTURE INDICATED

## 2022-01-27 LAB — CBC
HCT: 46.3 % (ref 38.5–50.0)
Hemoglobin: 15.6 g/dL (ref 13.2–17.1)
MCH: 34.7 pg — ABNORMAL HIGH (ref 27.0–33.0)
MCHC: 33.7 g/dL (ref 32.0–36.0)
MCV: 102.9 fL — ABNORMAL HIGH (ref 80.0–100.0)
MPV: 10.3 fL (ref 7.5–12.5)
Platelets: 317 10*3/uL (ref 140–400)
RBC: 4.5 10*6/uL (ref 4.20–5.80)
RDW: 13.6 % (ref 11.0–15.0)
WBC: 3.3 10*3/uL — ABNORMAL LOW (ref 3.8–10.8)

## 2022-01-27 LAB — APTT: aPTT: 27 s (ref 23–32)

## 2022-01-27 LAB — PROTIME-INR
INR: 1
Prothrombin Time: 10.5 s (ref 9.0–11.5)

## 2022-01-27 NOTE — Assessment & Plan Note (Signed)
Persistently elevated mean corpuscular volume (MCV), I would like to go ahead and add B12, folate levels as well as a pathology smear to the blood already in the lab.

## 2022-01-27 NOTE — Addendum Note (Signed)
Addended by: Silverio Decamp on: 01/27/2022 09:23 AM   Modules accepted: Orders

## 2022-01-30 ENCOUNTER — Telehealth: Payer: Self-pay

## 2022-01-30 NOTE — Telephone Encounter (Signed)
Just like I told him hah, does he still want to go through with the aggressive workup?  If not I can cancel the imaging.

## 2022-01-30 NOTE — Telephone Encounter (Signed)
Left detailed VM requesting a call back if he wants to cancel the additional testing.

## 2022-01-30 NOTE — Telephone Encounter (Signed)
FYI - Patient called to report that the bleeding in his semen has stopped.

## 2022-02-06 ENCOUNTER — Ambulatory Visit (INDEPENDENT_AMBULATORY_CARE_PROVIDER_SITE_OTHER): Payer: Medicare PPO

## 2022-02-06 DIAGNOSIS — R361 Hematospermia: Secondary | ICD-10-CM

## 2022-02-06 DIAGNOSIS — H401131 Primary open-angle glaucoma, bilateral, mild stage: Secondary | ICD-10-CM | POA: Diagnosis not present

## 2022-02-06 DIAGNOSIS — H2513 Age-related nuclear cataract, bilateral: Secondary | ICD-10-CM | POA: Diagnosis not present

## 2022-02-06 MED ORDER — GADOBUTROL 1 MMOL/ML IV SOLN
10.0000 mL | Freq: Once | INTRAVENOUS | Status: AC | PRN
  Administered 2022-02-06: 10 mL via INTRAVENOUS

## 2022-02-20 ENCOUNTER — Ambulatory Visit: Payer: Medicare PPO | Admitting: Sports Medicine

## 2022-04-19 IMAGING — DX DG SHOULDER 2+V*R*
3 series · 3 of 3 positions shown · non-contrast
Comparison: None.

CLINICAL DATA: Right shoulder pain.

EXAM:
RIGHT SHOULDER - 2+ VIEW

[shoulder grashey]
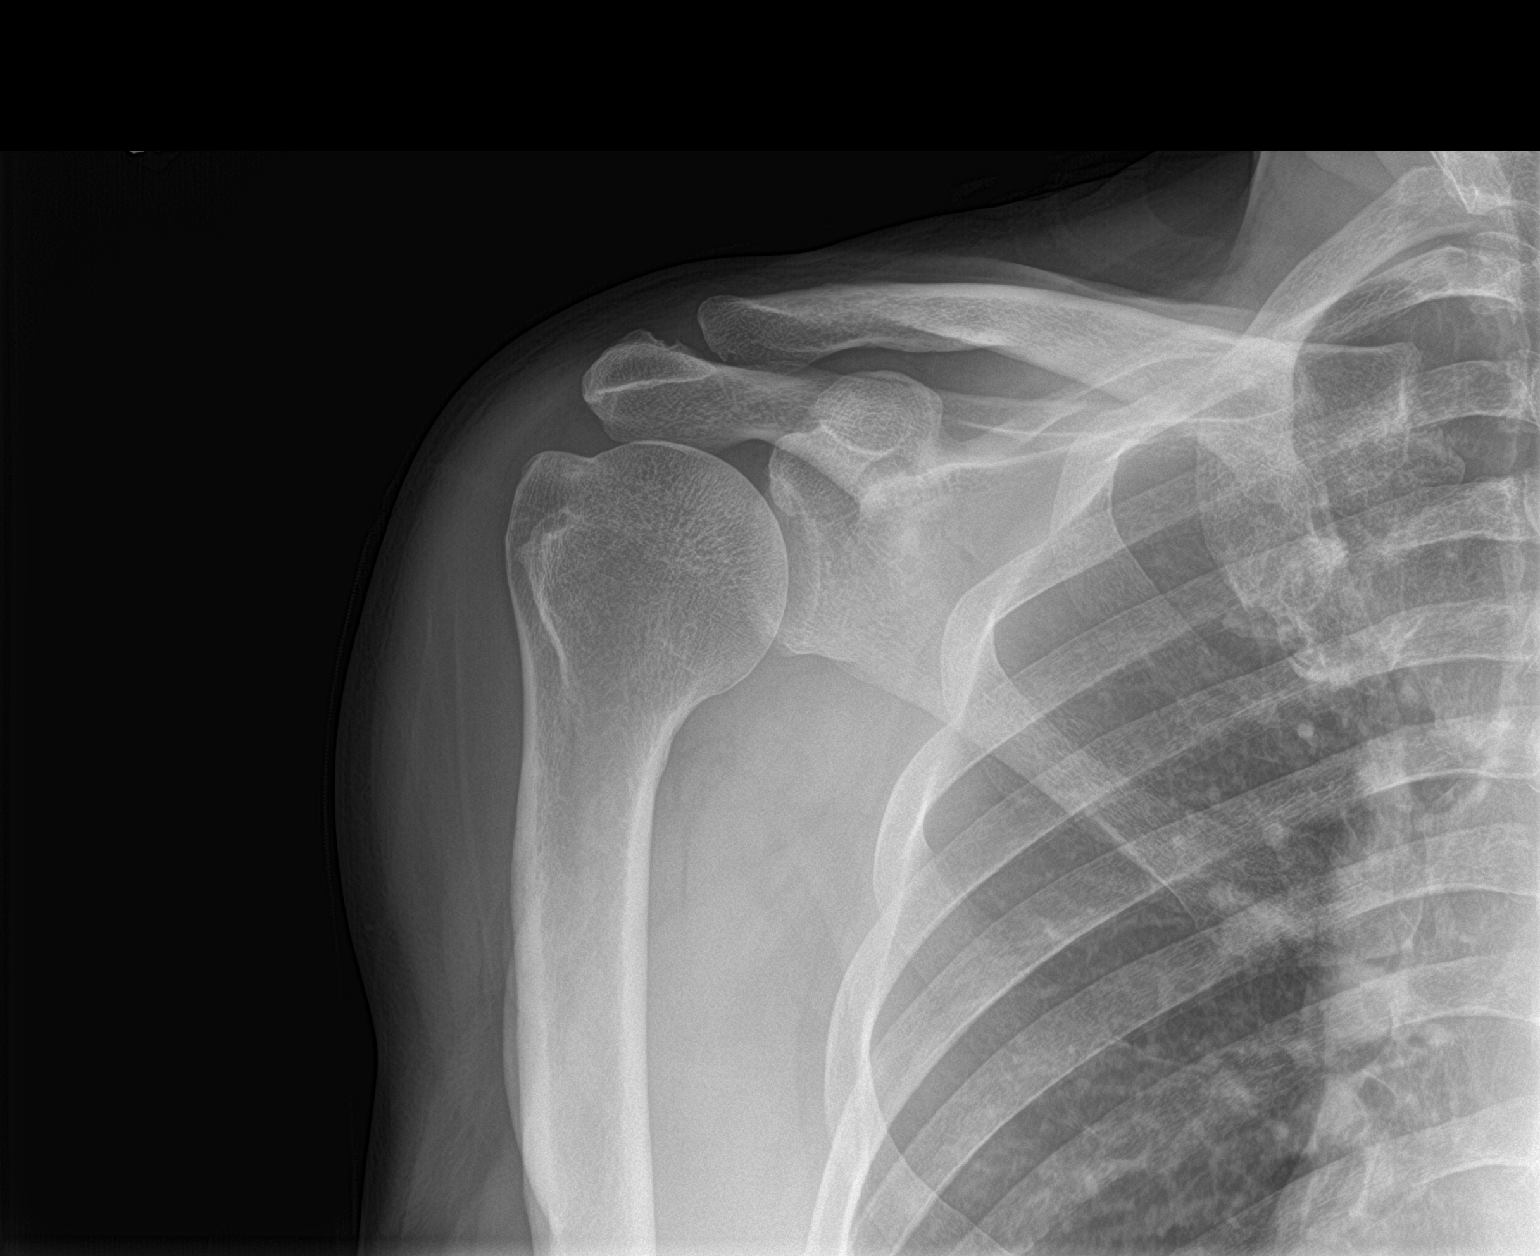

[shoulder y view]
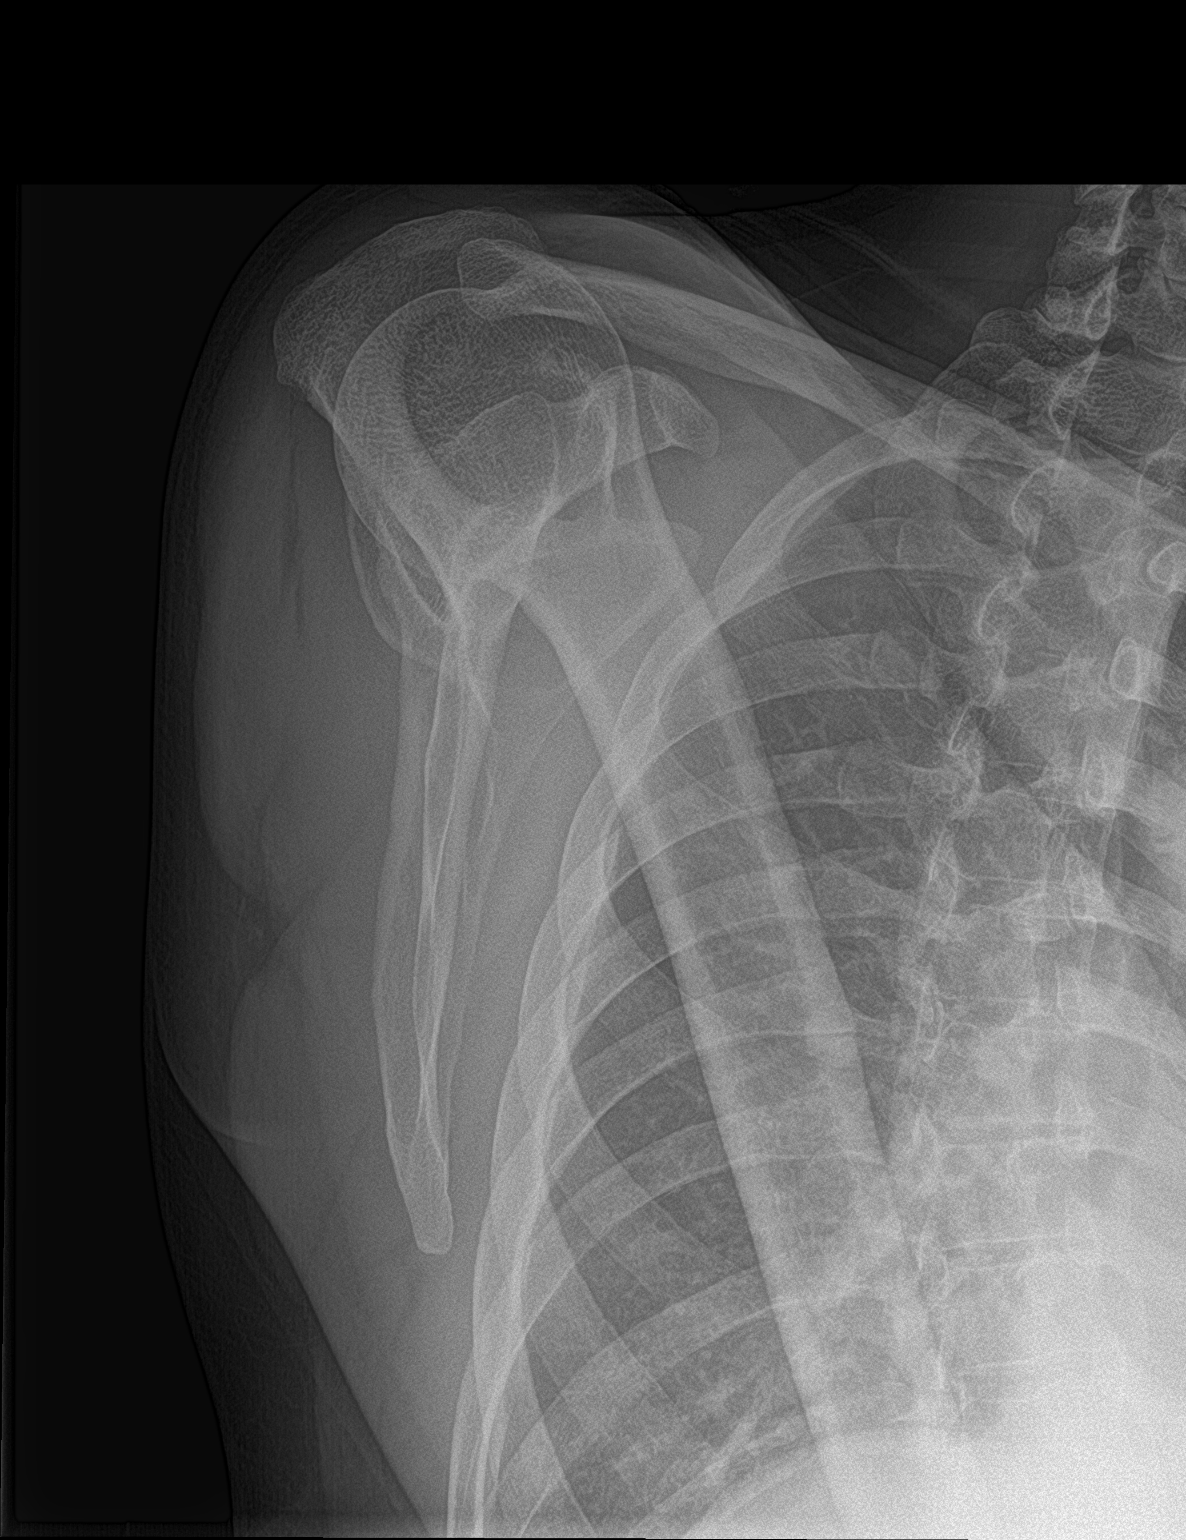

[shoulder axillary]
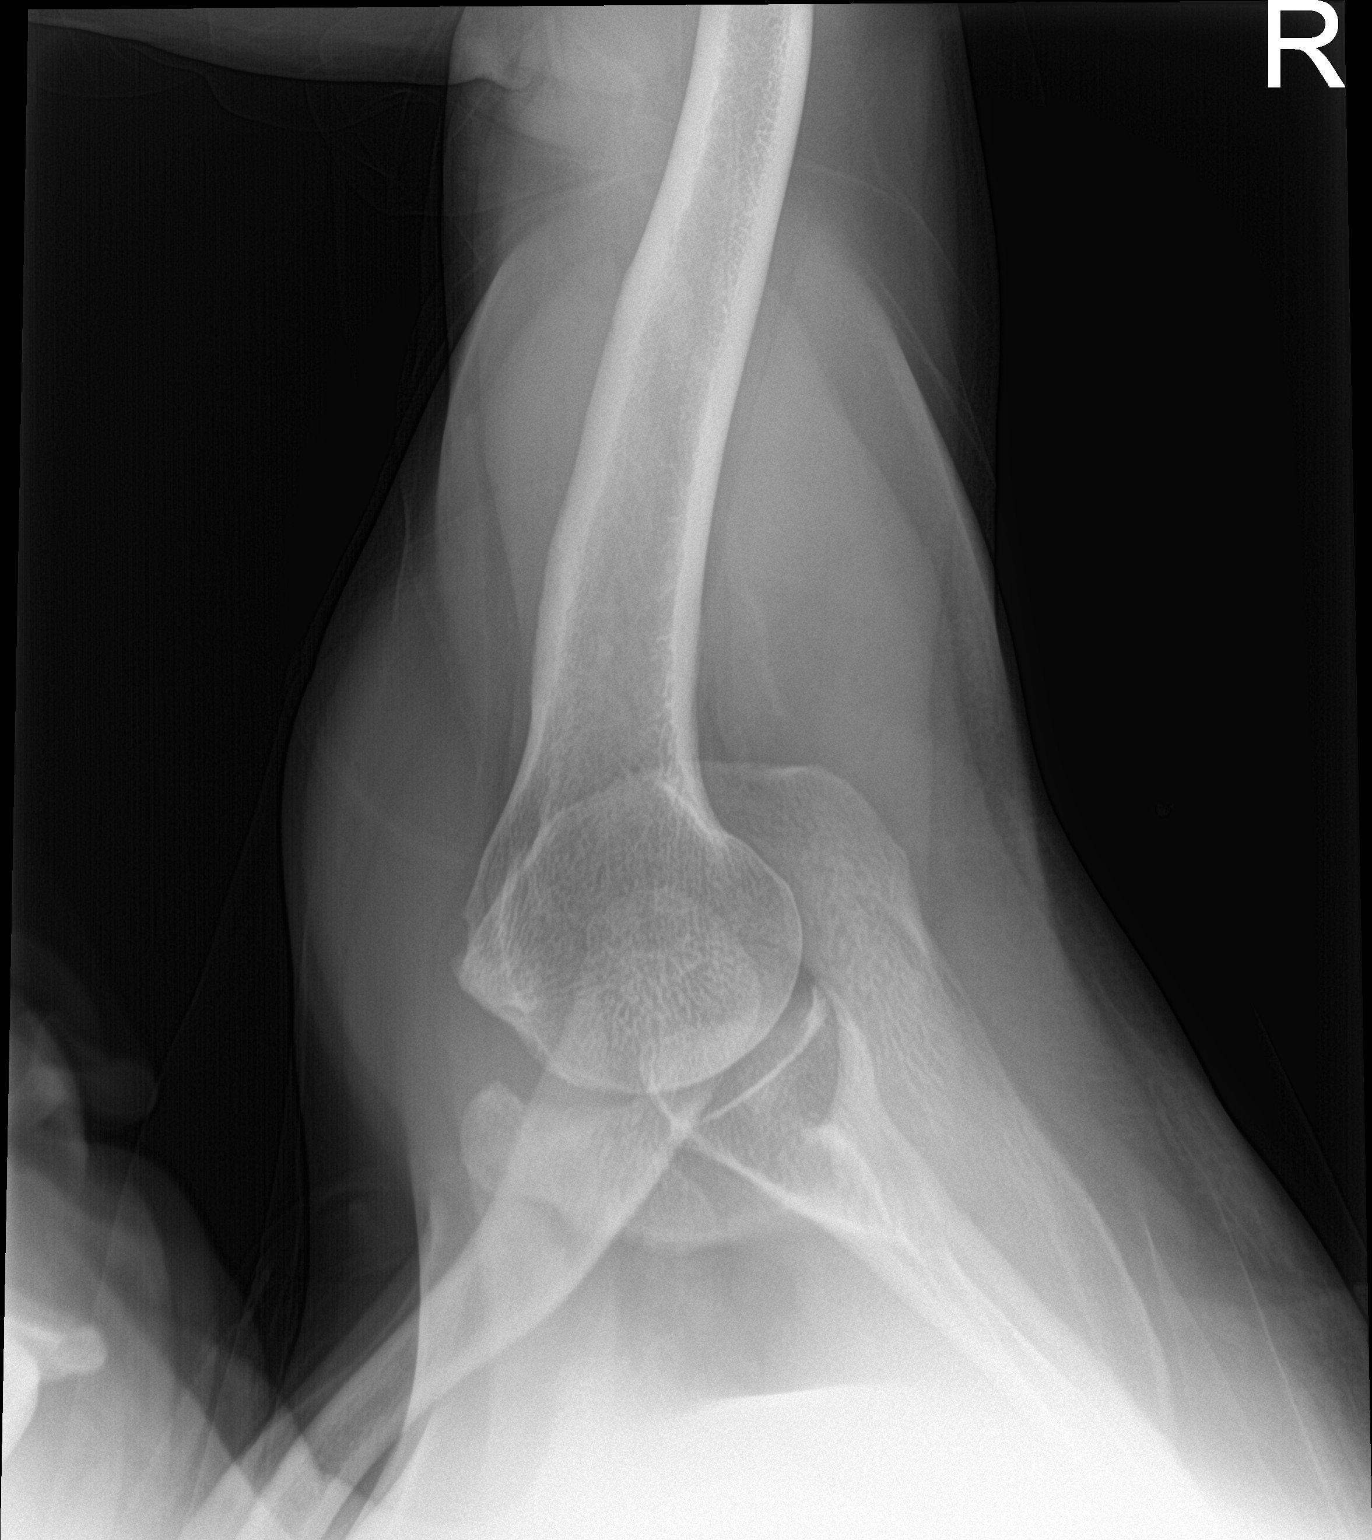

[3 of 3 positions shown; findings below may reference images not displayed]

FINDINGS: The joint spaces are maintained. No acute bony findings or bone
lesion. No abnormal soft tissue calcifications. The visualized lung
is clear and the visualized ribs are intact.
IMPRESSION: Normal right shoulder radiographs.

## 2022-06-15 ENCOUNTER — Other Ambulatory Visit: Payer: Self-pay | Admitting: Sports Medicine

## 2022-06-15 DIAGNOSIS — N529 Male erectile dysfunction, unspecified: Secondary | ICD-10-CM

## 2022-06-29 ENCOUNTER — Telehealth: Payer: Self-pay

## 2022-06-29 DIAGNOSIS — N529 Male erectile dysfunction, unspecified: Secondary | ICD-10-CM

## 2022-06-29 NOTE — Telephone Encounter (Signed)
Patient left VM and states needs refill on cialis please send in.

## 2022-06-30 MED ORDER — TADALAFIL 5 MG PO TABS: 5.0000 mg | ORAL_TABLET | Freq: Every day | ORAL | 3 refills | Status: DC

## 2022-06-30 NOTE — Addendum Note (Signed)
Addended by: Silverio Decamp on: 06/30/2022 09:34 AM   Modules accepted: Orders

## 2022-06-30 NOTE — Telephone Encounter (Signed)
Refilled

## 2022-07-06 ENCOUNTER — Telehealth: Payer: Self-pay | Admitting: Sports Medicine

## 2022-07-06 DIAGNOSIS — E785 Hyperlipidemia, unspecified: Secondary | ICD-10-CM

## 2022-07-06 DIAGNOSIS — M5136 Other intervertebral disc degeneration, lumbar region: Secondary | ICD-10-CM

## 2022-07-06 DIAGNOSIS — N400 Enlarged prostate without lower urinary tract symptoms: Secondary | ICD-10-CM

## 2022-07-06 DIAGNOSIS — R739 Hyperglycemia, unspecified: Secondary | ICD-10-CM

## 2022-07-06 DIAGNOSIS — M51369 Other intervertebral disc degeneration, lumbar region without mention of lumbar back pain or lower extremity pain: Secondary | ICD-10-CM

## 2022-07-06 NOTE — Telephone Encounter (Signed)
Pt called requesting to have blood work or labs ordered before his physical on 07/17/2022. Pt would like to go over the results during his appointment.

## 2022-07-06 NOTE — Telephone Encounter (Signed)
Left voicemail informing patient labs have been ordered.

## 2022-07-06 NOTE — Telephone Encounter (Signed)
Done

## 2022-07-11 DIAGNOSIS — N400 Enlarged prostate without lower urinary tract symptoms: Secondary | ICD-10-CM | POA: Diagnosis not present

## 2022-07-11 DIAGNOSIS — R739 Hyperglycemia, unspecified: Secondary | ICD-10-CM | POA: Diagnosis not present

## 2022-07-11 DIAGNOSIS — E785 Hyperlipidemia, unspecified: Secondary | ICD-10-CM | POA: Diagnosis not present

## 2022-07-12 ENCOUNTER — Encounter: Payer: Self-pay | Admitting: Sports Medicine

## 2022-07-12 LAB — COMPREHENSIVE METABOLIC PANEL WITH GFR
AG Ratio: 1.5 (calc) (ref 1.0–2.5)
ALT: 21 U/L (ref 9–46)
AST: 20 U/L (ref 10–35)
Albumin: 4.2 g/dL (ref 3.6–5.1)
Alkaline phosphatase (APISO): 50 U/L (ref 35–144)
BUN: 17 mg/dL (ref 7–25)
CO2: 25 mmol/L (ref 20–32)
Calcium: 9.4 mg/dL (ref 8.6–10.3)
Chloride: 105 mmol/L (ref 98–110)
Creat: 1.09 mg/dL (ref 0.70–1.28)
Globulin: 2.8 g/dL (ref 1.9–3.7)
Glucose, Bld: 103 mg/dL — ABNORMAL HIGH (ref 65–99)
Potassium: 4.4 mmol/L (ref 3.5–5.3)
Sodium: 138 mmol/L (ref 135–146)
Total Bilirubin: 0.5 mg/dL (ref 0.2–1.2)
Total Protein: 7 g/dL (ref 6.1–8.1)

## 2022-07-12 LAB — HEMOGLOBIN A1C
Hgb A1c MFr Bld: 6 %{Hb} — ABNORMAL HIGH
Mean Plasma Glucose: 126 mg/dL
eAG (mmol/L): 7 mmol/L

## 2022-07-12 LAB — CBC
HCT: 42.8 % (ref 38.5–50.0)
Hemoglobin: 14.7 g/dL (ref 13.2–17.1)
MCH: 35.1 pg — ABNORMAL HIGH (ref 27.0–33.0)
MCHC: 34.3 g/dL (ref 32.0–36.0)
MCV: 102.1 fL — ABNORMAL HIGH (ref 80.0–100.0)
MPV: 10.3 fL (ref 7.5–12.5)
Platelets: 296 Thousand/uL (ref 140–400)
RBC: 4.19 Million/uL — ABNORMAL LOW (ref 4.20–5.80)
RDW: 13.7 % (ref 11.0–15.0)
WBC: 3.8 Thousand/uL (ref 3.8–10.8)

## 2022-07-12 LAB — TSH: TSH: 0.83 m[IU]/L (ref 0.40–4.50)

## 2022-07-12 LAB — LIPID PANEL
Cholesterol: 201 mg/dL — ABNORMAL HIGH
HDL: 46 mg/dL
LDL Cholesterol (Calc): 135 mg/dL — ABNORMAL HIGH
Non-HDL Cholesterol (Calc): 155 mg/dL — ABNORMAL HIGH
Total CHOL/HDL Ratio: 4.4 (calc)
Triglycerides: 101 mg/dL

## 2022-07-12 LAB — PSA, TOTAL AND FREE
PSA, % Free: 33 %
PSA, Free: 0.2 ng/mL
PSA, Total: 0.6 ng/mL

## 2022-07-17 ENCOUNTER — Encounter: Payer: Self-pay | Admitting: Sports Medicine

## 2022-07-17 ENCOUNTER — Ambulatory Visit (INDEPENDENT_AMBULATORY_CARE_PROVIDER_SITE_OTHER): Payer: Medicare PPO | Admitting: Sports Medicine

## 2022-07-17 VITALS — BP 106/75 | HR 87 | Ht 71.0 in | Wt 195.0 lb

## 2022-07-17 DIAGNOSIS — E785 Hyperlipidemia, unspecified: Secondary | ICD-10-CM

## 2022-07-17 DIAGNOSIS — Z0000 Encounter for general adult medical examination without abnormal findings: Secondary | ICD-10-CM

## 2022-07-17 NOTE — Progress Notes (Signed)
Subjective:    CC: Annual Physical Exam  HPI:  This patient is here for their annual physical  I reviewed the past medical history, family history, social history, surgical history, and allergies today and no changes were needed.  Please see the problem list section below in epic for further details.  Past Medical History: Past Medical History:  Diagnosis Date   Glaucoma 2019   Hyperlipidemia    Past Surgical History: Past Surgical History:  Procedure Laterality Date   HEMORRHOID SURGERY  [id number]   Social History: Social History   Socioeconomic History   Marital status: Married    Spouse name: Alma   Number of children: Not on file   Years of education: 18   Highest education level: Master's degree (e.g., MA, MS, MEng, MEd, MSW, MBA)  Occupational History   Occupation: Retired  Tobacco Use   Smoking status: Former    Types: Cigarettes    Quit date: 04/03/2010    Years since quitting: 12.2   Smokeless tobacco: Never  Vaping Use   Vaping Use: Never used  Substance and Sexual Activity   Alcohol use: Yes    Alcohol/week: 4.0 standard drinks of alcohol    Types: 4 Glasses of wine per week   Drug use: No   Sexual activity: Yes    Birth control/protection: None  Other Topics Concern   Not on file  Social History Narrative   Lives with his wife. He is retired and enjoys Naval architect, hunt and working in the yard.   Social Determinants of Health   Financial Resource Strain: Low Risk  (02/04/2021)   Overall Financial Resource Strain (CARDIA)    Difficulty of Paying Living Expenses: Not hard at all  Food Insecurity: No Food Insecurity (02/04/2021)   Hunger Vital Sign    Worried About Running Out of Food in the Last Year: Never true    Ran Out of Food in the Last Year: Never true  Transportation Needs: No Transportation Needs (02/04/2021)   PRAPARE - Administrator, Civil Service (Medical): No    Lack of Transportation (Non-Medical): No  Physical Activity:  Insufficiently Active (02/04/2021)   Exercise Vital Sign    Days of Exercise per Week: 3 days    Minutes of Exercise per Session: 30 min  Stress: No Stress Concern Present (02/04/2021)   [employer] of Occupational Health - Occupational Stress Questionnaire    Feeling of Stress : Not at all  Social Connections: Socially Integrated (02/04/2021)   Social Connection and Isolation Panel [NHANES]    Frequency of Communication with Friends and Family: More than three times a week    Frequency of Social Gatherings with Friends and Family: Twice a week    Attends Religious Services: More than 4 times per year    Active Member of [employer] or Organizations: Yes    Attends Engineer, structural: More than 4 times per year    Marital Status: Married   Family History: Family History  Problem Relation Age of Onset   Heart disease Mother    Stroke Mother    Allergies: Allergies  Allergen Reactions   Amoxicillin Swelling   Biaxin [Clarithromycin] Swelling   Medications: See med rec.  Review of Systems: No headache, visual changes, nausea, vomiting, diarrhea, constipation, dizziness, abdominal pain, skin rash, fevers, chills, night sweats, swollen lymph nodes, weight loss, chest pain, body aches, joint swelling, muscle aches, shortness of breath, mood changes, visual or auditory hallucinations.  Objective:  General: Well Developed, well nourished, and in no acute distress.  Neuro: Alert and oriented x3, extra-ocular muscles intact, sensation grossly intact. Cranial nerves II through XII are intact, motor, sensory, and coordinative functions are all intact. HEENT: Normocephalic, atraumatic, pupils equal round reactive to light, neck supple, no masses, no lymphadenopathy, thyroid nonpalpable. Oropharynx, nasopharynx, external ear canals are unremarkable. Skin: Warm and dry, no rashes noted.  Cardiac: Regular rate and rhythm, no murmurs rubs or gallops.  Respiratory: Clear to  auscultation bilaterally. Not using accessory muscles, speaking in full sentences.  Abdominal: Soft, nontender, nondistended, positive bowel sounds, no masses, no organomegaly.  Musculoskeletal: Shoulder, elbow, wrist, hip, knee, ankle stable, and with full range of motion.  Impression and Recommendations:    The patient was counselled, risk factors were discussed, anticipatory guidance given.  Annual physical exam Annual physical as above, up-to-date on screenings. He would like to sleep at a more regular time, he tells me he does tend to go to bed maybe 1:00, wakes up about 5 hours later, he does tend to nap during the day.  We discussed cutting out the nap as well as bright screens, caffeine, we can discuss this in further detail in about 3 months. Will also be rechecking his lipids in 3 months.  Hyperlipidemia LDL goal <70 LDL continues to be a bit high, we can recheck in 3 months per patient request.   ____________________________________________ Ihor Austin. Benjamin Stain, M.D., ABFM., CAQSM., AME. Primary Care and Sports Medicine [place] [facility]  Adjunct Professor of Family Medicine  [place] [employer] [facility] [employer]  Restaurant manager, fast food

## 2022-07-17 NOTE — Assessment & Plan Note (Addendum)
Annual physical as above, up-to-date on screenings. He would like to sleep at a more regular time, he tells me he does tend to go to bed maybe 1:00, wakes up about 5 hours later, he does tend to nap during the day.  We discussed cutting out the nap as well as bright screens, caffeine, we can discuss this in further detail in about 3 months. Will also be rechecking his lipids in 3 months.

## 2022-07-17 NOTE — Assessment & Plan Note (Signed)
LDL continues to be a bit high, we can recheck in 3 months per patient request.

## 2022-08-22 DIAGNOSIS — H401131 Primary open-angle glaucoma, bilateral, mild stage: Secondary | ICD-10-CM | POA: Diagnosis not present

## 2022-08-22 DIAGNOSIS — H2513 Age-related nuclear cataract, bilateral: Secondary | ICD-10-CM | POA: Diagnosis not present

## 2022-08-22 DIAGNOSIS — H04123 Dry eye syndrome of bilateral lacrimal glands: Secondary | ICD-10-CM | POA: Diagnosis not present

## 2022-10-17 ENCOUNTER — Ambulatory Visit: Payer: Medicare PPO | Admitting: Sports Medicine

## 2022-11-07 ENCOUNTER — Ambulatory Visit (INDEPENDENT_AMBULATORY_CARE_PROVIDER_SITE_OTHER): Payer: Medicare PPO | Admitting: Sports Medicine

## 2022-11-07 DIAGNOSIS — Z0000 Encounter for general adult medical examination without abnormal findings: Secondary | ICD-10-CM

## 2022-11-07 NOTE — Patient Instructions (Addendum)
MEDICARE ANNUAL WELLNESS VISIT Health Maintenance Summary and Written Plan of Care  [name] ,  Thank you for allowing me to perform your Medicare Annual Wellness Visit and for your ongoing commitment to your health.   Health Maintenance & Immunization History Health Maintenance  Topic Date Due   COVID-19 Vaccine (8 - 2023-24 season) 11/23/2022 (Originally 11/04/2022)   Colonoscopy  12/01/2022 (Originally 06/30/2021)   INFLUENZA VACCINE  01/01/2023 (Originally 11/02/2022)   Medicare Annual Wellness (AWV)  11/07/2023   DTaP/Tdap/Td (3 - Td or Tdap) 09/23/2029   Pneumonia Vaccine 54+ Years old  Completed   Hepatitis C Screening  Completed   Zoster Vaccines- Shingrix  Completed   HPV VACCINES  Aged Out   Immunization History  Administered Date(s) Administered   COVID-19, mRNA, vaccine(Comirnaty)12 years and older 07/05/2022   Influenza Split 03/13/2011   Influenza, High Dose Seasonal PF 02/01/2018, 01/02/2022   Influenza, Seasonal, Injecte, Preservative Fre 03/04/2012   Influenza,inj,Quad PF,6+ Mos 02/17/2013, 02/13/2017   Influenza,trivalent, recombinat, inj, PF 03/13/2011   Influenza-Unspecified 01/28/2014, 01/28/2016, 01/04/2021   PFIZER Comirnaty(Gray Top)Covid-19 Tri-Sucrose Vaccine 01/02/2022   PFIZER(Purple Top)SARS-COV-2 Vaccination 04/26/2019, 05/17/2019, 12/30/2019, 08/02/2020, 02/01/2021   Pneumococcal Conjugate-13 08/21/2017   Pneumococcal Polysaccharide-23 08/27/2018   Respiratory Syncytial Virus Vaccine,Recomb Aduvanted(Arexvy) 01/17/2022   Tdap 07/13/2008, 09/24/2019   Zoster Recombinant(Shingrix) 01/08/2018, 05/09/2018   Zoster, Live 11/16/2015, 01/08/2018    These are the patient goals that we discussed:  Goals Addressed               This Visit's Progress     Patient Stated (pt-stated)        Patient stated that he would like to eat better and get more sleep.         This is a list of Health Maintenance Items that are overdue or due now: Influenza  vaccine - vaccine not available Colonoscopy- Patient is schedule for 11/30/22 at [facility]. He will have the records faxed.     Orders/Referrals Placed Today: No orders of the defined types were placed in this encounter.  (Contact our referral department at [phone] if you have not spoken with someone about your referral appointment within the next 5 days)    Follow-up Plan Follow-up with Monica Becton, MD as planned Colonoscopy- Patient is schedule for 11/30/22 at [facility]. He will have the records faxed.  Medicare wellness visit in one year.  Patient will access AVS on my chart.      Health Maintenance, Male Adopting a healthy lifestyle and getting preventive care are important in promoting health and wellness. Ask your health care provider about: The right schedule for you to have regular tests and exams. Things you can do on your own to prevent diseases and keep yourself healthy. What should I know about diet, weight, and exercise? Eat a healthy diet  Eat a diet that includes plenty of vegetables, fruits, low-fat dairy products, and lean protein. Do not eat a lot of foods that are high in solid fats, added sugars, or sodium. Maintain a healthy weight Body mass index (BMI) is a measurement that can be used to identify possible weight problems. It estimates body fat based on height and weight. Your health care provider can help determine your BMI and help you achieve or maintain a healthy weight. Get regular exercise Get regular exercise. This is one of the most important things you can do for your health. Most adults should: Exercise for at least 150 minutes each week. The  exercise should increase your heart rate and make you sweat (moderate-intensity exercise). Do strengthening exercises at least twice a week. This is in addition to the moderate-intensity exercise. Spend less time sitting. Even light physical activity can be beneficial. Watch cholesterol and  blood lipids Have your blood tested for lipids and cholesterol at 70 years of age, then have this test every 5 years. You may need to have your cholesterol levels checked more often if: Your lipid or cholesterol levels are high. You are older than 70 years of age. You are at high risk for heart disease. What should I know about cancer screening? Many types of cancers can be detected early and may often be prevented. Depending on your health history and family history, you may need to have cancer screening at various ages. This may include screening for: Colorectal cancer. Prostate cancer. Skin cancer. Lung cancer. What should I know about heart disease, diabetes, and high blood pressure? Blood pressure and heart disease High blood pressure causes heart disease and increases the risk of stroke. This is more likely to develop in people who have high blood pressure readings or are overweight. Talk with your health care provider about your target blood pressure readings. Have your blood pressure checked: Every 3-5 years if you are 11-68 years of age. Every year if you are 19 years old or older. If you are between the ages of 62 and 33 and are a current or former smoker, ask your health care provider if you should have a one-time screening for abdominal aortic aneurysm (AAA). Diabetes Have regular diabetes screenings. This checks your fasting blood sugar level. Have the screening done: Once every three years after age 4 if you are at a normal weight and have a low risk for diabetes. More often and at a younger age if you are overweight or have a high risk for diabetes. What should I know about preventing infection? Hepatitis B If you have a higher risk for hepatitis B, you should be screened for this virus. Talk with your health care provider to find out if you are at risk for hepatitis B infection. Hepatitis C Blood testing is recommended for: Everyone born from [birth year] through 1963. Anyone  with known risk factors for hepatitis C. Sexually transmitted infections (STIs) You should be screened each year for STIs, including gonorrhea and chlamydia, if: You are sexually active and are younger than 70 years of age. You are older than 70 years of age and your health care provider tells you that you are at risk for this type of infection. Your sexual activity has changed since you were last screened, and you are at increased risk for chlamydia or gonorrhea. Ask your health care provider if you are at risk. Ask your health care provider about whether you are at high risk for HIV. Your health care provider may recommend a prescription medicine to help prevent HIV infection. If you choose to take medicine to prevent HIV, you should first get tested for HIV. You should then be tested every 3 months for as long as you are taking the medicine. Follow these instructions at home: Alcohol use Do not drink alcohol if your health care provider tells you not to drink. If you drink alcohol: Limit how much you have to 0-2 drinks a day. Know how much alcohol is in your drink. In the [place] one drink equals one 12 oz bottle of beer (355 mL), one 5 oz glass of wine (148 mL), or one  1 oz glass of hard liquor (44 mL). Lifestyle Do not use any products that contain nicotine or tobacco. These products include cigarettes, chewing tobacco, and vaping devices, such as e-cigarettes. If you need help quitting, ask your health care provider. Do not use street drugs. Do not share needles. Ask your health care provider for help if you need support or information about quitting drugs. General instructions Schedule regular health, dental, and eye exams. Stay current with your vaccines. Tell your health care provider if: You often feel depressed. You have ever been abused or do not feel safe at home. Summary Adopting a healthy lifestyle and getting preventive care are important in promoting health and  wellness. Follow your health care provider's instructions about healthy diet, exercising, and getting tested or screened for diseases. Follow your health care provider's instructions on monitoring your cholesterol and blood pressure. This information is not intended to replace advice given to you by your health care provider. Make sure you discuss any questions you have with your health care provider. Document Revised: 08/09/2020 Document Reviewed: 08/09/2020 Elsevier Patient Education  2024 [employer].

## 2022-11-07 NOTE — Progress Notes (Addendum)
MEDICARE ANNUAL WELLNESS VISIT  11/07/2022  Telephone Visit Disclaimer This Medicare AWV was conducted by telephone due to national recommendations for restrictions regarding the COVID-19 Pandemic (e.g. social distancing).  I verified, using two identifiers, that I am speaking with [name] or their authorized healthcare agent. I discussed the limitations, risks, security, and privacy concerns of performing an evaluation and management service by telephone and the potential availability of an in-person appointment in the future. The patient expressed understanding and agreed to proceed.  Location of Patient: Home Location of Provider (nurse):  In the clinic.  Subjective:    Adam Hatfield is a 72 y.o. male patient of Thekkekandam, Adam Austin, MD who had a Medicare Annual Wellness Visit today via telephone. Adam Hatfield is Retired and lives with their spouse. he does not have any children. he reports that he is socially active and does interact with friends/family regularly. he is moderately physically active and enjoys enjoys playing golf, hunt and working in the yard.  Patient Care Team: Monica Becton, MD as PCP - General (Family Medicine)     11/07/2022    8:11 AM 02/04/2021   10:16 AM 11/02/2016   11:52 AM  Advanced Directives  Does Patient Have a Medical Advance Directive? No No No  Does patient want to make changes to medical advance directive? Yes (MAU/Ambulatory/Procedural Areas - Information given)    Would patient like information on creating a medical advance directive?  No - Patient declined No - Patient declined    Hospital Utilization Over the Past 12 Months: # of hospitalizations or ER visits: 0 # of surgeries: 0  Review of Systems    Patient reports that his overall health is unchanged compared to last year.  History obtained from chart review and the patient  Patient Reported Readings (BP, Pulse, CBG, Weight, etc) Per patient no change in vitals  since last visit, unable to obtain new vitals due to telehealth visit and lack of equipment.  Pain Assessment Pain : 0-10 Pain Score: 1  Pain Location: Hip Pain Orientation: Left Pain Radiating Towards: legs Pain Descriptors / Indicators: Constant Pain Onset: More than a month ago Pain Frequency: Intermittent Pain Relieving Factors: Physical therapy  Pain Relieving Factors: Physical therapy  Current Medications & Allergies (verified) Allergies as of 11/07/2022       Reactions   Amoxicillin Swelling   Biaxin [clarithromycin] Swelling        Medication List        Accurate as of November 07, 2022  8:27 AM. If you have any questions, ask your nurse or doctor.          STOP taking these medications    celecoxib 200 MG capsule Commonly known as: CeleBREX       TAKE these medications    Omega-3 1000 MG Caps Take 1 capsule by mouth daily.   psyllium 58.6 % powder Commonly known as: METAMUCIL Take 1 packet by mouth as needed.   tadalafil 5 MG tablet Commonly known as: CIALIS Take 1 tablet (5 mg total) by mouth daily.   Travatan Z 0.004 % Soln ophthalmic solution Generic drug: Travoprost (BAK Free)        History (reviewed): Past Medical History:  Diagnosis Date   Glaucoma 2019   Hyperlipidemia    Past Surgical History:  Procedure Laterality Date   HEMORRHOID SURGERY  [id number]   Family History  Problem Relation Age of Onset   Heart disease Mother    Stroke Mother  Social History   Socioeconomic History   Marital status: Married    Spouse name: Alma   Number of children: Not on file   Years of education: 18   Highest education level: Master's degree (e.g., MA, MS, MEng, MEd, MSW, MBA)  Occupational History   Occupation: Retired  Tobacco Use   Smoking status: Former    Current packs/day: 0.00    Types: Cigarettes    Quit date: 04/03/2010    Years since quitting: 12.6   Smokeless tobacco: Never  Vaping Use   Vaping status: Never Used   Substance and Sexual Activity   Alcohol use: Yes    Alcohol/week: 4.0 standard drinks of alcohol    Types: 4 Glasses of wine per week   Drug use: No   Sexual activity: Yes    Birth control/protection: None  Other Topics Concern   Not on file  Social History Narrative   Lives with his wife. He is retired and enjoys Naval architect, hunt and working in the yard.   Social Determinants of Health   Financial Resource Strain: Low Risk  (11/06/2022)   Overall Financial Resource Strain (CARDIA)    Difficulty of Paying Living Expenses: Not hard at all  Food Insecurity: No Food Insecurity (11/06/2022)   Hunger Vital Sign    Worried About Running Out of Food in the Last Year: Never true    Ran Out of Food in the Last Year: Never true  Transportation Needs: No Transportation Needs (11/06/2022)   PRAPARE - Administrator, Civil Service (Medical): No    Lack of Transportation (Non-Medical): No  Physical Activity: Sufficiently Active (11/06/2022)   Exercise Vital Sign    Days of Exercise per Week: 4 days    Minutes of Exercise per Session: 40 min  Stress: No Stress Concern Present (11/06/2022)   [employer] of Occupational Health - Occupational Stress Questionnaire    Feeling of Stress : Only a little  Social Connections: Socially Integrated (11/07/2022)   Social Connection and Isolation Panel [NHANES]    Frequency of Communication with Friends and Family: Three times a week    Frequency of Social Gatherings with Friends and Family: Twice a week    Attends Religious Services: More than 4 times per year    Active Member of [employer] or Organizations: Yes    Attends Banker Meetings: More than 4 times per year    Marital Status: Married    Activities of Daily Living    11/06/2022   11:51 PM  In your present state of health, do you have any difficulty performing the following activities:  Hearing? 0  Vision? 0  Difficulty concentrating or making decisions? 0  Walking or  climbing stairs? 1  Dressing or bathing? 0  Doing errands, shopping? 0  Preparing Food and eating ? N  Using the Toilet? N  In the past six months, have you accidently leaked urine? N  Do you have problems with loss of bowel control? N  Managing your Medications? N  Managing your Finances? N  Housekeeping or managing your Housekeeping? N    Patient Education/ Literacy How often do you need to have someone help you when you read instructions, pamphlets, or other written materials from your doctor or pharmacy?: 2 - Rarely What is the last grade level you completed in school?: 2 masters degrees  Exercise    Diet Patient reports consuming 3 meals a day and 2 snack(s) a day Patient reports  that his primary diet is: Regular Patient reports that she does have regular access to food.   Depression Screen    11/07/2022    8:13 AM 07/17/2022    2:08 PM 03/23/2021   10:37 AM 02/04/2021   10:17 AM 04/01/2020   10:36 AM 08/28/2019    9:32 AM 08/17/2016   11:03 AM  PHQ 2/9 Scores  PHQ - 2 Score 0 0 0 0 0 0 0     Fall Risk    11/07/2022    8:13 AM 11/06/2022   11:51 PM 07/17/2022    2:07 PM 02/04/2021   10:17 AM 04/01/2020   10:36 AM  Fall Risk   Falls in the past year? 0 0 0 0 0  Number falls in past yr: 0 0 0 0 0  Injury with Fall? 0 0 0 0 0  Risk for fall due to : No Fall Risks   No Fall Risks   Follow up Falls evaluation completed  Falls evaluation completed Falls evaluation completed      Objective:  Makael Isaza seemed alert and oriented and he participated appropriately during our telephone visit.  Blood Pressure Weight BMI  BP Readings from Last 3 Encounters:  07/17/22 106/75  01/25/22 114/74  03/23/21 122/75   Wt Readings from Last 3 Encounters:  07/17/22 195 lb (88.5 kg)  01/25/22 197 lb (89.4 kg)  01/13/22 195 lb (88.5 kg)   BMI Readings from Last 1 Encounters:  07/17/22 27.20 kg/m    *Unable to obtain current vital signs, weight, and BMI due to telephone  visit type  Hearing/Vision  Jakson did not seem to have difficulty with hearing/understanding during the telephone conversation Reports that he has had a formal eye exam by an eye care professional within the past year Reports that he has not had a formal hearing evaluation within the past year *Unable to fully assess hearing and vision during telephone visit type  Cognitive Function:    11/07/2022    8:15 AM 02/04/2021   10:24 AM 08/21/2017    8:55 AM  6CIT Screen  What Year? 0 points 0 points 0 points  What month? 0 points 0 points 0 points  What time? 0 points 0 points 0 points  Count back from 20 0 points 0 points 0 points  Months in reverse 0 points 0 points 0 points  Repeat phrase 0 points 2 points 0 points  Total Score 0 points 2 points 0 points   (Normal:0-7, Significant for Dysfunction: >8)  Normal Cognitive Function Screening: Yes   Immunization & Health Maintenance Record Immunization History  Administered Date(s) Administered   COVID-19, mRNA, vaccine(Comirnaty)12 years and older 07/05/2022   Influenza Split 03/13/2011   Influenza, High Dose Seasonal PF 02/01/2018, 01/02/2022   Influenza, Seasonal, Injecte, Preservative Fre 03/04/2012   Influenza,inj,Quad PF,6+ Mos 02/17/2013, 02/13/2017   Influenza,trivalent, recombinat, inj, PF 03/13/2011   Influenza-Unspecified 01/28/2014, 01/28/2016, 01/04/2021   PFIZER Comirnaty(Gray Top)Covid-19 Tri-Sucrose Vaccine 01/02/2022   PFIZER(Purple Top)SARS-COV-2 Vaccination 04/26/2019, 05/17/2019, 12/30/2019, 08/02/2020, 02/01/2021   Pneumococcal Conjugate-13 08/21/2017   Pneumococcal Polysaccharide-23 08/27/2018   Respiratory Syncytial Virus Vaccine,Recomb Aduvanted(Arexvy) 01/17/2022   Tdap 07/13/2008, 09/24/2019   Zoster Recombinant(Shingrix) 01/08/2018, 05/09/2018   Zoster, Live 11/16/2015, 01/08/2018    Health Maintenance  Topic Date Due   COVID-19 Vaccine (8 - 2023-24 season) 11/23/2022 (Originally 11/04/2022)    Colonoscopy  12/01/2022 (Originally 06/30/2021)   INFLUENZA VACCINE  01/01/2023 (Originally 11/02/2022)   Medicare Annual Wellness (AWV)  11/07/2023  DTaP/Tdap/Td (3 - Td or Tdap) 09/23/2029   Pneumonia Vaccine 14+ Years old  Completed   Hepatitis C Screening  Completed   Zoster Vaccines- Shingrix  Completed   HPV VACCINES  Aged Out       Assessment  This is a routine wellness examination for Adam Hatfield.  Health Maintenance: Due or Overdue There are no preventive care reminders to display for this patient.   Aryon Alberson does not need a referral for [employer] Assistance: Care Management:   no Social Work:    no Prescription Assistance:  no Nutrition/Diabetes Education:  no   Plan:  Personalized Goals  Goals Addressed               This Visit's Progress     Patient Stated (pt-stated)        Patient stated that he would like to eat better and get more sleep.       Personalized Health Maintenance & Screening Recommendations  Influenza vaccine - vaccine not available Colonoscopy- Patient is schedule for 11/30/22 at [facility]. He will have the records faxed.   Lung Cancer Screening Recommended: no (Low Dose CT Chest recommended if Age 88-80 years, 20 pack-year currently smoking OR have quit w/in past 15 years) Hepatitis C Screening recommended: no HIV Screening recommended: no  Advanced Directives: Written information was not prepared per patient's request.  Referrals & Orders No orders of the defined types were placed in this encounter.   Follow-up Plan Follow-up with Monica Becton, MD as planned Colonoscopy- Patient is schedule for 11/30/22 at [facility]. He will have the records faxed.  Medicare wellness visit in one year.  Patient will access AVS on my chart.   I have personally reviewed and noted the following in the patient's chart:   Medical and social history Use of alcohol, tobacco or illicit drugs  Current medications and  supplements Functional ability and status Nutritional status Physical activity Advanced directives List of other physicians Hospitalizations, surgeries, and ER visits in previous 12 months Vitals Screenings to include cognitive, depression, and falls Referrals and appointments  In addition, I have reviewed and discussed with Lavonta Peggs certain preventive protocols, quality metrics, and best practice recommendations. A written personalized care plan for preventive services as well as general preventive health recommendations is available and can be mailed to the patient at his request.      Modesto Charon, RN BSN  11/07/2022

## 2022-11-30 DIAGNOSIS — D122 Benign neoplasm of ascending colon: Secondary | ICD-10-CM | POA: Diagnosis not present

## 2022-11-30 DIAGNOSIS — Z1211 Encounter for screening for malignant neoplasm of colon: Secondary | ICD-10-CM | POA: Diagnosis not present

## 2022-12-19 ENCOUNTER — Other Ambulatory Visit: Payer: Self-pay | Admitting: Sports Medicine

## 2022-12-19 DIAGNOSIS — E785 Hyperlipidemia, unspecified: Secondary | ICD-10-CM

## 2022-12-19 NOTE — Progress Notes (Signed)
Procedures performed today:    None.  Independent interpretation of notes and tests performed by another provider:   None.  Brief History, Exam, Impression, and Recommendations:    No problem-specific Assessment & Plan notes found for this encounter.    ____________________________________________ Ihor Austin. Benjamin Stain, M.D., ABFM., CAQSM., AME. Primary Care and Sports Medicine [place] [facility]  Adjunct Professor of Family Medicine  [place] [employer] [facility] [employer]  Restaurant manager, fast food

## 2022-12-20 LAB — COMPREHENSIVE METABOLIC PANEL WITH GFR
ALT: 24 IU/L (ref 0–44)
AST: 22 IU/L (ref 0–40)
Albumin: 4.3 g/dL (ref 3.9–4.9)
Alkaline Phosphatase: 55 IU/L (ref 44–121)
BUN/Creatinine Ratio: 13 (ref 10–24)
BUN: 13 mg/dL (ref 8–27)
Bilirubin Total: 0.4 mg/dL (ref 0.0–1.2)
CO2: 24 mmol/L (ref 20–29)
Calcium: 9.1 mg/dL (ref 8.6–10.2)
Chloride: 104 mmol/L (ref 96–106)
Creatinine, Ser: 1.01 mg/dL (ref 0.76–1.27)
Globulin, Total: 2.4 g/dL (ref 1.5–4.5)
Glucose: 103 mg/dL — ABNORMAL HIGH (ref 70–99)
Potassium: 4.2 mmol/L (ref 3.5–5.2)
Sodium: 140 mmol/L (ref 134–144)
Total Protein: 6.7 g/dL (ref 6.0–8.5)
eGFR: 80 mL/min/1.73 (ref >59)

## 2022-12-20 LAB — CBC
Hematocrit: 43.1 % (ref 37.5–51.0)
Hemoglobin: 14.7 g/dL (ref 13.0–17.7)
MCH: 35.3 pg — ABNORMAL HIGH (ref 26.6–33.0)
MCHC: 34.1 g/dL (ref 31.5–35.7)
MCV: 103 fL — ABNORMAL HIGH (ref 79–97)
Platelets: 284 x10E3/uL (ref 150–450)
RBC: 4.17 x10E6/uL (ref 4.14–5.80)
RDW: 13.6 % (ref 11.6–15.4)
WBC: 3.7 x10E3/uL (ref 3.4–10.8)

## 2022-12-20 LAB — HEMOGLOBIN A1C
Est. average glucose Bld gHb Est-mCnc: 120 mg/dL
Hgb A1c MFr Bld: 5.8 % — ABNORMAL HIGH (ref 4.8–5.6)

## 2022-12-20 LAB — LIPID PANEL
Chol/HDL Ratio: 4.1 ratio (ref 0.0–5.0)
Cholesterol, Total: 195 mg/dL (ref 100–199)
HDL: 47 mg/dL (ref >39)
LDL Chol Calc (NIH): 130 mg/dL — ABNORMAL HIGH (ref 0–99)
Triglycerides: 99 mg/dL (ref 0–149)
VLDL Cholesterol Cal: 18 mg/dL (ref 5–40)

## 2022-12-20 LAB — TSH: TSH: 1.25 u[IU]/mL (ref 0.450–4.500)

## 2022-12-25 ENCOUNTER — Encounter: Payer: Self-pay | Admitting: Sports Medicine

## 2022-12-25 ENCOUNTER — Ambulatory Visit: Payer: Medicare PPO | Admitting: Sports Medicine

## 2022-12-25 VITALS — BP 130/84 | HR 79 | Ht 71.0 in | Wt 188.0 lb

## 2022-12-25 DIAGNOSIS — E785 Hyperlipidemia, unspecified: Secondary | ICD-10-CM | POA: Diagnosis not present

## 2022-12-25 NOTE — Progress Notes (Signed)
    Procedures performed today:    None.  Independent interpretation of notes and tests performed by another provider:   None.  Brief History, Exam, Impression, and Recommendations:    Hyperlipidemia LDL goal <70 Patient will work on additional dietary changes, we did have a really good talk about lipids. Return to see me as needed, he can do a physical in April of next year.  I spent 30 minutes of total time managing this patient today, this includes chart review, face to face, and non-face to face time.  We had a long discussion about dietary changes and other lifestyle changes that can impact his cholesterol and A1c.  ____________________________________________ Ihor Austin. Benjamin Stain, M.D., ABFM., CAQSM., AME. Primary Care and Sports Medicine [place] [facility]  Adjunct Professor of Family Medicine  [place] of [facility] [employer]  Restaurant manager, fast food

## 2022-12-25 NOTE — Assessment & Plan Note (Signed)
Patient will work on additional dietary changes, we did have a really good talk about lipids. Return to see me as needed, he can do a physical in April of next year.

## 2023-02-22 DIAGNOSIS — H04123 Dry eye syndrome of bilateral lacrimal glands: Secondary | ICD-10-CM | POA: Diagnosis not present

## 2023-02-22 DIAGNOSIS — H2513 Age-related nuclear cataract, bilateral: Secondary | ICD-10-CM | POA: Diagnosis not present

## 2023-02-22 DIAGNOSIS — H401131 Primary open-angle glaucoma, bilateral, mild stage: Secondary | ICD-10-CM | POA: Diagnosis not present

## 2023-02-26 DIAGNOSIS — H524 Presbyopia: Secondary | ICD-10-CM | POA: Diagnosis not present

## 2023-05-17 ENCOUNTER — Encounter: Payer: Self-pay | Admitting: Sports Medicine

## 2023-05-17 ENCOUNTER — Ambulatory Visit: Payer: Medicare PPO | Admitting: Sports Medicine

## 2023-05-17 VITALS — BP 128/72 | HR 83 | Temp 98.2°F

## 2023-05-17 DIAGNOSIS — J111 Influenza due to unidentified influenza virus with other respiratory manifestations: Secondary | ICD-10-CM

## 2023-05-17 LAB — POCT INFLUENZA A/B
Influenza A, POC: NEGATIVE
Influenza B, POC: NEGATIVE

## 2023-05-17 LAB — POC COVID19 BINAXNOW: SARS Coronavirus 2 Ag: NEGATIVE

## 2023-05-17 NOTE — Patient Instructions (Signed)

## 2023-05-17 NOTE — Progress Notes (Signed)
    Procedures performed today:    None.  Independent interpretation of notes and tests performed by another provider:   None.  Brief History, Exam, Impression, and Recommendations:    Influenza-like illness This is a very pleasant 73 year old male, he has been on a couple of trips with his friends, over the last couple of days he has noted increasing low-grade fevers, cough, runny nose, sore throat. Fatigue and malaise. His exam is for the most part unrevealing, he looks well, speaking full sentences with no signs of respiratory distress, he does have mild postnasal drip and pharyngeal erythema, otherwise oropharynx, nasopharynx, ear canals are unremarkable, lungs are clear. Open flu testing is negative. He will do over-the-counter cold and flu medications, hydration, relative rest and return to see me as needed.    ____________________________________________ Adam Hatfield. Adam Hatfield, M.D., ABFM., CAQSM., AME. Primary Care and Sports Medicine  [facility]  Adjunct Professor of Family Medicine  [place] of [facility] [employer]  Restaurant manager, fast food

## 2023-05-17 NOTE — Assessment & Plan Note (Signed)
This is a very pleasant 73 year old male, he has been on a couple of trips with his friends, over the last couple of days he has noted increasing low-grade fevers, cough, runny nose, sore throat. Fatigue and malaise. His exam is for the most part unrevealing, he looks well, speaking full sentences with no signs of respiratory distress, he does have mild postnasal drip and pharyngeal erythema, otherwise oropharynx, nasopharynx, ear canals are unremarkable, lungs are clear. Open flu testing is negative. He will do over-the-counter cold and flu medications, hydration, relative rest and return to see me as needed.

## 2023-05-17 NOTE — Addendum Note (Signed)
Addended by: Carren Rang A on: 05/17/2023 02:06 PM   Modules accepted: Orders

## 2023-05-21 IMAGING — DX DG CERVICAL SPINE COMPLETE 4+V
6 series · 6 of 6 positions shown · non-contrast
Comparison: None.

CLINICAL DATA: Left neck pain.

EXAM:
CERVICAL SPINE - COMPLETE 4+ VIEW

[c-spine lat]
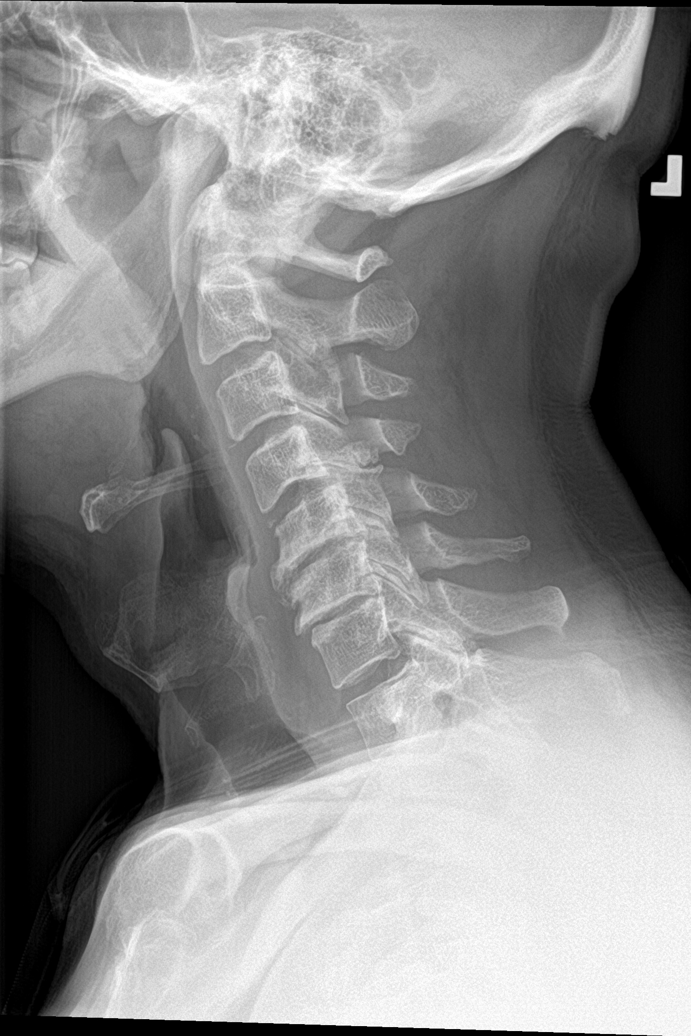

[c-spine obl (1 of 2)]
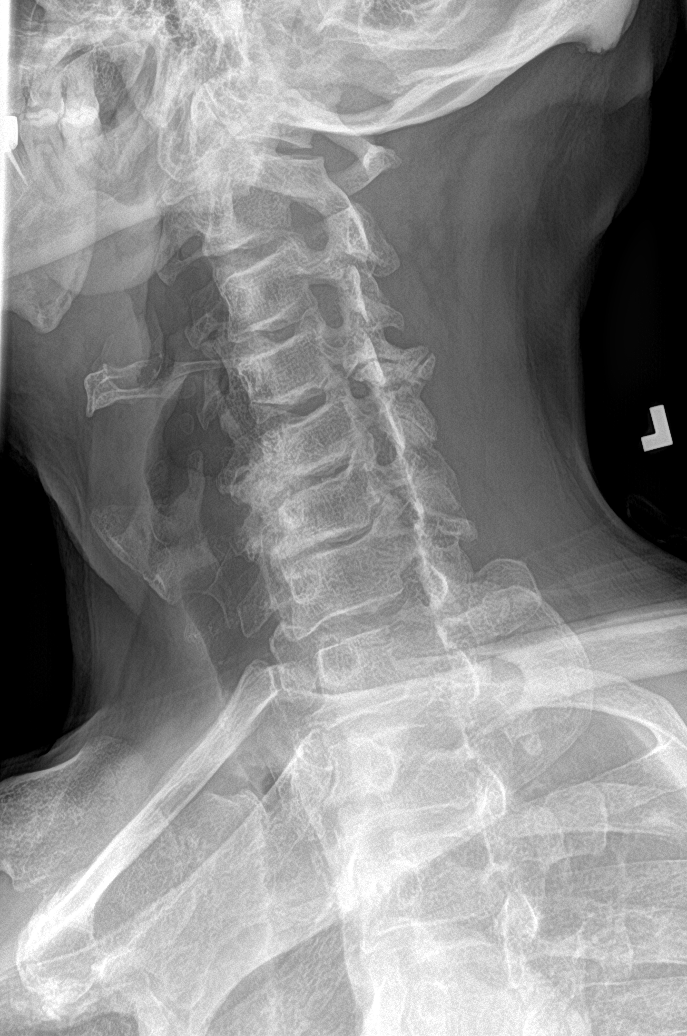

[c-spine obl (2 of 2)]
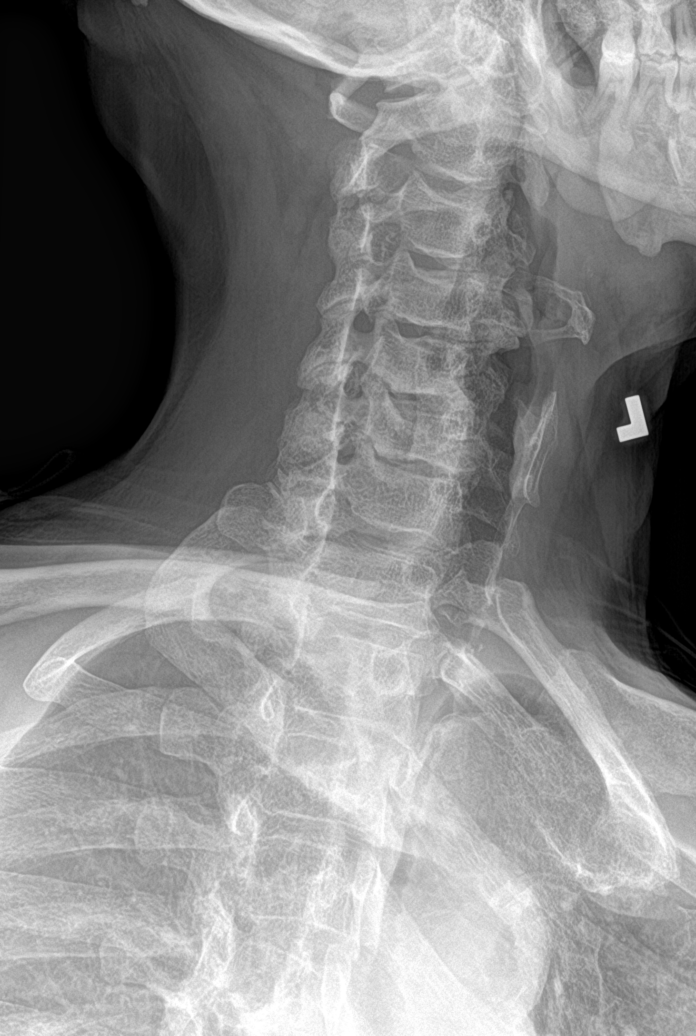

[c-spine ap]
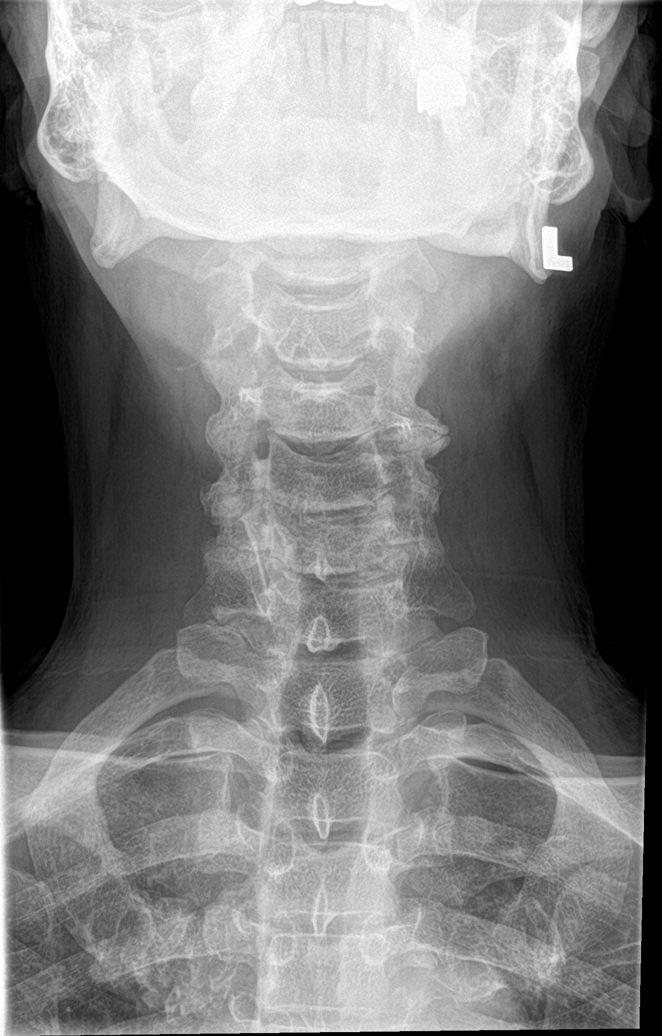

[c-spine open mouth]
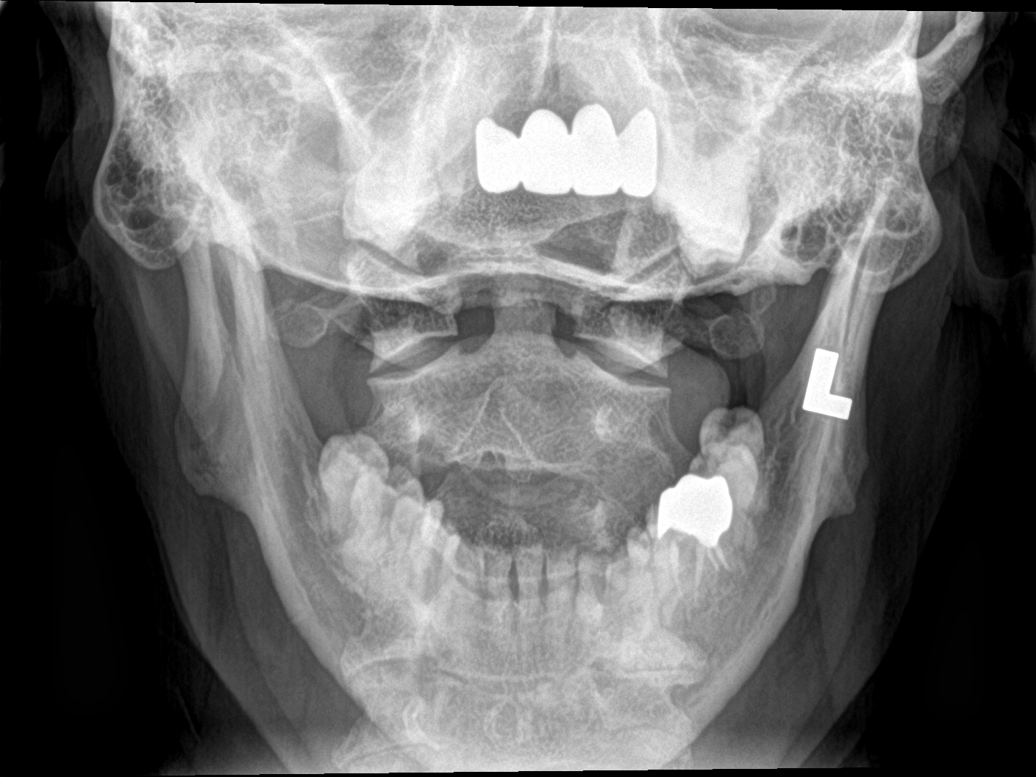

[c-spine swimmers]
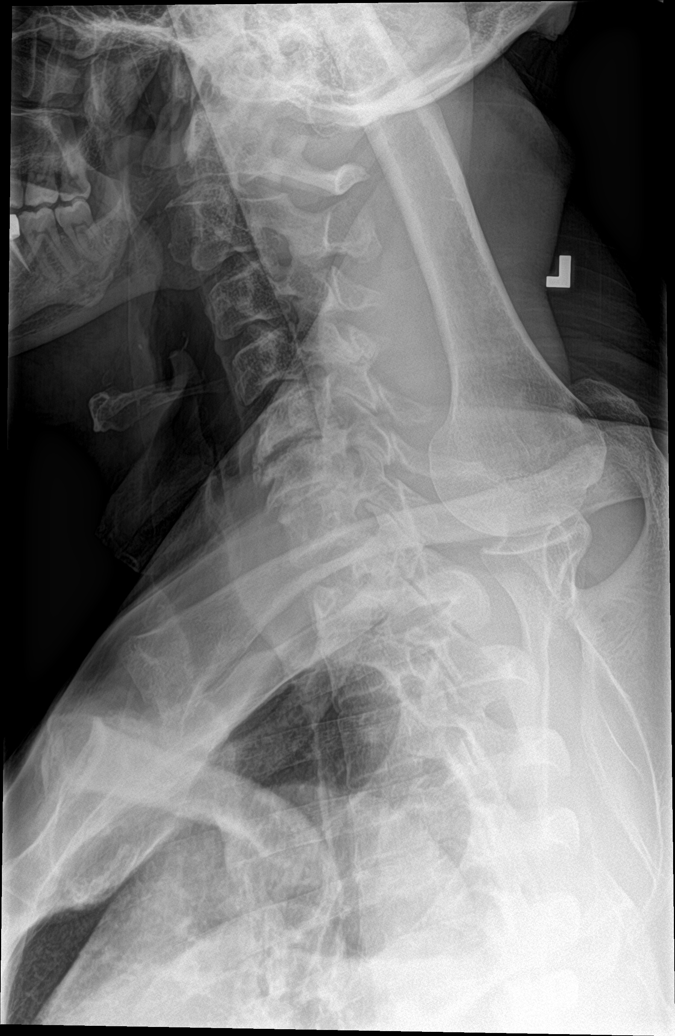

[6 of 6 positions shown; findings below may reference images not displayed]

FINDINGS: There is no evidence of cervical spine fracture or prevertebral soft
tissue swelling. Alignment is normal. There is intervertebral disc
space narrowing and endplate osteophyte formation at C5-C6 and C6-C7
compatible with degenerative change.
IMPRESSION: 1. Moderate degenerative changes at C5-C6 and C6-C7.
2. No acute fracture or subluxation.

## 2023-06-24 ENCOUNTER — Other Ambulatory Visit: Payer: Self-pay | Admitting: Sports Medicine

## 2023-06-24 DIAGNOSIS — N529 Male erectile dysfunction, unspecified: Secondary | ICD-10-CM

## 2023-08-21 DIAGNOSIS — H04123 Dry eye syndrome of bilateral lacrimal glands: Secondary | ICD-10-CM | POA: Diagnosis not present

## 2023-08-21 DIAGNOSIS — H2513 Age-related nuclear cataract, bilateral: Secondary | ICD-10-CM | POA: Diagnosis not present

## 2023-08-21 DIAGNOSIS — H401131 Primary open-angle glaucoma, bilateral, mild stage: Secondary | ICD-10-CM | POA: Diagnosis not present

## 2023-11-15 ENCOUNTER — Ambulatory Visit (INDEPENDENT_AMBULATORY_CARE_PROVIDER_SITE_OTHER)

## 2023-11-15 VITALS — Ht 71.5 in | Wt 192.0 lb

## 2023-11-15 DIAGNOSIS — Z0000 Encounter for general adult medical examination without abnormal findings: Secondary | ICD-10-CM

## 2023-11-15 DIAGNOSIS — Z122 Encounter for screening for malignant neoplasm of respiratory organs: Secondary | ICD-10-CM

## 2023-11-15 NOTE — Patient Instructions (Signed)
  Mr. Lesser , Thank you for taking time to come for your Medicare Wellness Visit. I appreciate your ongoing commitment to your health goals. Please review the following plan we discussed and let me know if I can assist you in the future.   These are the goals we discussed:  Goals       Patient Stated (pt-stated)      02/04/2021 AWV Goal: Exercise for General Health  Patient will verbalize understanding of the benefits of increased physical activity: Exercising regularly is important. It will improve your overall fitness, flexibility, and endurance. Regular exercise also will improve your overall health. It can help you control your weight, reduce stress, and improve your bone density. Over the next year, patient will increase physical activity as tolerated with a goal of at least 150 minutes of moderate physical activity per week.  You can tell that you are exercising at a moderate intensity if your heart starts beating faster and you start breathing faster but can still hold a conversation. Moderate-intensity exercise ideas include: Walking 1 mile (1.6 km) in about 15 minutes Biking Hiking Golfing Dancing Water aerobics Patient will verbalize understanding of everyday activities that increase physical activity by providing examples like the following: Yard work, such as: Insurance underwriter Gardening Washing windows or floors Patient will be able to explain general safety guidelines for exercising:  Before you start a new exercise program, talk with your health care provider. Do not exercise so much that you hurt yourself, feel dizzy, or get very short of breath. Wear comfortable clothes and wear shoes with good support. Drink plenty of water while you exercise to prevent dehydration or heat stroke. Work out until your breathing and your heartbeat get faster.       Patient Stated (pt-stated)       Patient stated that he would like to eat better and get more sleep.      Patient Stated      Patient states he would like to eat healthy.         This is a list of the screening recommended for you and due dates:  Health Maintenance  Topic Date Due   COVID-19 Vaccine (8 - 2024-25 season) 12/03/2022   Flu Shot  11/02/2023   Medicare Annual Wellness Visit  11/14/2024   DTaP/Tdap/Td vaccine (3 - Td or Tdap) 09/23/2029   Colon Cancer Screening  11/29/2029   Pneumococcal Vaccine for age over 68  Completed   Hepatitis C Screening  Completed   Zoster (Shingles) Vaccine  Completed   Hepatitis B Vaccine  Aged Out   HPV Vaccine  Aged Out   Meningitis B Vaccine  Aged Out

## 2023-11-15 NOTE — Progress Notes (Signed)
 Subjective:   Adam Hatfield is a 73 y.o. male who presents for Medicare Annual/Subsequent preventive examination.  Visit Complete: Virtual I connected with  [name] on 11/15/23 by a audio enabled telemedicine application and verified that I am speaking with the correct person using two identifiers.  Patient Location: Home  Provider Location: Office/Clinic  I discussed the limitations of evaluation and management by telemedicine. The patient expressed understanding and agreed to proceed.  Vital Signs: Because this visit was a virtual/telehealth visit, some criteria may be missing or patient reported. Any vitals not documented were not able to be obtained and vitals that have been documented are patient reported.  Patient Medicare AWV questionnaire was completed by the patient on n/a; I have confirmed that all information answered by patient is correct and no changes since this date.  Cardiac Risk Factors include: advanced age (>83men, >29 women);male gender;dyslipidemia;smoking/ tobacco exposure;family history of premature cardiovascular disease     Objective:    Today's Vitals   11/15/23 0759  Weight: 192 lb (87.1 kg)  Height: 5' 11.5 (1.816 m)   Body mass index is 26.41 kg/m.     11/15/2023    8:12 AM 11/07/2022    8:11 AM 02/04/2021   10:16 AM 11/02/2016   11:52 AM  Advanced Directives  Does Patient Have a Medical Advance Directive? Yes No No No   Type of Advance Directive Healthcare Power of Attorney     Does patient want to make changes to medical advance directive? No - Patient declined Yes (MAU/Ambulatory/Procedural Areas - Information given)    Copy of Healthcare Power of Attorney in Chart? No - copy requested     Would patient like information on creating a medical advance directive?   No - Patient declined No - Patient declined      Data saved with a previous flowsheet row definition     Current Medications (verified) Outpatient Encounter Medications  as of 11/15/2023  Medication Sig   aspirin  EC 81 MG tablet Take 81 mg by mouth daily. Swallow whole.   Omega-3 1000 MG CAPS Take 1 capsule by mouth daily.   psyllium (METAMUCIL) 58.6 % powder Take 1 packet by mouth as needed.   tadalafil  (CIALIS ) 5 MG tablet TAKE 1 TABLET (5 MG TOTAL) BY MOUTH DAILY.   TRAVATAN Z 0.004 % SOLN ophthalmic solution    No facility-administered encounter medications on file as of 11/15/2023.    Allergies (verified) Amoxicillin and Biaxin [clarithromycin]   History: Past Medical History:  Diagnosis Date   Glaucoma 2019   Hyperlipidemia    Past Surgical History:  Procedure Laterality Date   HEMORRHOID SURGERY  [id number]   Family History  Problem Relation Age of Onset   Heart disease Mother    Stroke Mother    Social History   Socioeconomic History   Marital status: Married    Spouse name: Alma   Number of children: Not on file   Years of education: 18   Highest education level: Master's degree (e.g., MA, MS, MEng, MEd, MSW, MBA)  Occupational History   Occupation: Retired  Tobacco Use   Smoking status: Former    Current packs/day: 0.00    Average packs/day: 0.5 packs/day for 38.3 years (19.2 ttl pk-yrs)    Types: Cigarettes    Start date: 12/07/1971    Quit date: 04/03/2010    Years since quitting: 13.6   Smokeless tobacco: Never  Vaping Use   Vaping status: Never Used  Substance and  Sexual Activity   Alcohol use: Yes    Alcohol/week: 4.0 standard drinks of alcohol    Types: 4 Glasses of wine per week   Drug use: No   Sexual activity: Yes    Birth control/protection: None  Other Topics Concern   Not on file  Social History Narrative   Lives with his wife. He is retired and enjoys Naval architect, hunt and working in the yard.   Social Drivers of Corporate investment banker Strain: Low Risk  (11/06/2022)   Overall Financial Resource Strain (CARDIA)    Difficulty of Paying Living Expenses: Not hard at all  Food Insecurity: No Food  Insecurity (11/15/2023)   Hunger Vital Sign    Worried About Running Out of Food in the Last Year: Never true    Ran Out of Food in the Last Year: Never true  Transportation Needs: No Transportation Needs (11/15/2023)   PRAPARE - Administrator, Civil Service (Medical): No    Lack of Transportation (Non-Medical): No  Physical Activity: Sufficiently Active (11/15/2023)   Exercise Vital Sign    Days of Exercise per Week: 2 days    Minutes of Exercise per Session: 90 min  Stress: No Stress Concern Present (11/15/2023)   [employer] of Occupational Health - Occupational Stress Questionnaire    Feeling of Stress: Not at all  Social Connections: Socially Integrated (11/15/2023)   Social Connection and Isolation Panel    Frequency of Communication with Friends and Family: More than three times a week    Frequency of Social Gatherings with Friends and Family: More than three times a week    Attends Religious Services: More than 4 times per year    Active Member of [employer] or Organizations: Yes    Attends Engineer, structural: More than 4 times per year    Marital Status: Married    Tobacco Counseling Counseling given: Not Answered   Clinical Intake:  Pre-visit preparation completed: Yes  Pain : No/denies pain     BMI - recorded: 26.41 Nutritional Status: BMI 25 -29 Overweight Nutritional Risks: None Diabetes: No  How often do you need to have someone help you when you read instructions, pamphlets, or other written materials from your doctor or pharmacy?: 1 - Never What is the last grade level you completed in school?: 18  Interpreter Needed?: No      Activities of Daily Living    11/15/2023    8:00 AM  In your present state of health, do you have any difficulty performing the following activities:  Hearing? 0  Vision? 0  Difficulty concentrating or making decisions? 0  Walking or climbing stairs? 0  Dressing or bathing? 0  Doing errands, shopping?  0  Preparing Food and eating ? N  Using the Toilet? N  In the past six months, have you accidently leaked urine? N  Do you have problems with loss of bowel control? N  Managing your Medications? N  Managing your Finances? N  Housekeeping or managing your Housekeeping? N    Patient Care Team: Curtis Debby PARAS, MD as PCP - General (Family Medicine)  Indicate any recent Medical Services you may have received from other than [facility] providers in the past year (date may be approximate).     Assessment:   This is a routine wellness examination for Adrienne.  Hearing/Vision screen No results found.   Goals Addressed  This Visit's Progress    Patient Stated       Patient states he would like to eat healthy.        Depression Screen    11/15/2023    8:11 AM 11/07/2022    8:13 AM 07/17/2022    2:08 PM 03/23/2021   10:37 AM 02/04/2021   10:17 AM 04/01/2020   10:36 AM 08/28/2019    9:32 AM  PHQ 2/9 Scores  PHQ - 2 Score 0 0 0 0 0 0 0    Fall Risk    11/15/2023    8:12 AM 11/07/2022    8:13 AM 11/06/2022   11:51 PM 07/17/2022    2:07 PM 02/04/2021   10:17 AM  Fall Risk   Falls in the past year? 0 0 0 0 0  Number falls in past yr: 0 0 0 0 0  Injury with Fall? 0 0 0 0 0  Risk for fall due to : No Fall Risks No Fall Risks   No Fall Risks  Follow up Falls evaluation completed Falls evaluation completed  Falls evaluation completed Falls evaluation completed      Data saved with a previous flowsheet row definition     MEDICARE RISK AT HOME: Medicare Risk at Home Any stairs in or around the home?: Yes If so, are there any without handrails?: Yes Home free of loose throw rugs in walkways, pet beds, electrical cords, etc?: Yes Adequate lighting in your home to reduce risk of falls?: Yes Life alert?: No Use of a cane, walker or w/c?: No Grab bars in the bathroom?: No Shower chair or bench in shower?: Yes Elevated toilet seat or a handicapped toilet?:  No  TIMED UP AND GO:  Was the test performed?  No    Cognitive Function:        11/15/2023    8:14 AM 11/07/2022    8:15 AM 02/04/2021   10:24 AM 08/21/2017    8:55 AM  6CIT Screen  What Year? 0 points 0 points 0 points 0 points  What month? 0 points 0 points 0 points 0 points  What time? 0 points 0 points 0 points 0 points  Count back from 20 0 points 0 points 0 points 0 points  Months in reverse 0 points 0 points 0 points 0 points  Repeat phrase 0 points 0 points 2 points 0 points  Total Score 0 points 0 points 2 points 0 points    Immunizations Immunization History  Administered Date(s) Administered   Influenza Split 03/13/2011   Influenza, High Dose Seasonal PF 02/01/2018, 01/02/2022   Influenza, Seasonal, Injecte, Preservative Fre 03/04/2012   Influenza,inj,Quad PF,6+ Mos 02/17/2013, 02/13/2017   Influenza,trivalent, recombinat, inj, PF 03/13/2011   Influenza-Unspecified 01/28/2014, 01/28/2016, 01/04/2021   PFIZER Comirnaty(Gray Top)Covid-19 Tri-Sucrose Vaccine 01/02/2022   PFIZER(Purple Top)SARS-COV-2 Vaccination 04/26/2019, 05/17/2019, 12/30/2019, 08/02/2020, 02/01/2021   Pfizer(Comirnaty)Fall Seasonal Vaccine 12 years and older 07/05/2022   Pneumococcal Conjugate-13 08/21/2017   Pneumococcal Polysaccharide-23 08/27/2018   Respiratory Syncytial Virus Vaccine,Recomb Aduvanted(Arexvy) 01/17/2022   Tdap 07/13/2008, 09/24/2019   Zoster Recombinant(Shingrix) 01/08/2018, 05/09/2018   Zoster, Live 11/16/2015, 01/08/2018    TDAP status: Up to date  Flu Vaccine status: Due, Education has been provided regarding the importance of this vaccine. Advised may receive this vaccine at local pharmacy or Health Dept. Aware to provide a copy of the vaccination record if obtained from local pharmacy or Health Dept. Verbalized acceptance and understanding.  Pneumococcal vaccine status: Up to date  Covid-19 vaccine status: Completed vaccines  Qualifies for Shingles Vaccine? Yes    Zostavax completed Yes   Shingrix Completed?: Yes  Screening Tests Health Maintenance  Topic Date Due   COVID-19 Vaccine (8 - 2024-25 season) 12/03/2022   INFLUENZA VACCINE  11/02/2023   Medicare Annual Wellness (AWV)  11/14/2024   DTaP/Tdap/Td (3 - Td or Tdap) 09/23/2029   Colonoscopy  11/29/2029   Pneumococcal Vaccine: 50+ Years  Completed   Hepatitis C Screening  Completed   Zoster Vaccines- Shingrix  Completed   Hepatitis B Vaccines  Aged Out   HPV VACCINES  Aged Out   Meningococcal B Vaccine  Aged Out    Health Maintenance  Health Maintenance Due  Topic Date Due   COVID-19 Vaccine (8 - 2024-25 season) 12/03/2022   INFLUENZA VACCINE  11/02/2023    Colorectal cancer screening: Type of screening: Colonoscopy. Completed 11/30/2022. Repeat every 7 years  Lung Cancer Screening: (Low Dose CT Chest recommended if Age 54-80 years, 20 pack-year currently smoking OR have quit w/in 15years.) does qualify.   Lung Cancer Screening Referral: referral placed today  Additional Screening:  Hepatitis C Screening: does qualify; Completed 08/13/2017  Vision Screening: Recommended annual ophthalmology exams for early detection of glaucoma and other disorders of the eye. Is the patient up to date with their annual eye exam?  Yes  Who is the provider or what is the name of the office in which the patient attends annual eye exams?  If pt is not established with a provider, would they like to be referred to a provider to establish care? N/a.   Dental Screening: Recommended annual dental exams for proper oral hygiene   Community Resource Referral / Chronic Care Management: CRR required this visit?  No   CCM required this visit?  No     Plan:     I have personally reviewed and noted the following in the patient's chart:   Medical and social history Use of alcohol, tobacco or illicit drugs  Current medications and supplements including opioid prescriptions. Patient is not currently  taking opioid prescriptions. Functional ability and status Nutritional status Physical activity Advanced directives List of other physicians Hospitalizations, surgeries, and ER visits in previous 12 months. None Vitals Screenings to include cognitive, depression, and falls Referrals and appointments  In addition, I have reviewed and discussed with patient certain preventive protocols, quality metrics, and best practice recommendations. A written personalized care plan for preventive services as well as general preventive health recommendations were provided to patient.     Bonny Jon Mayor, CMA   11/15/2023   After Visit Summary: (MyChart) Due to this being a telephonic visit, the after visit summary with patients personalized plan was offered to patient via MyChart   Nurse Notes:     Seng Hulgan is a 73 y.o. male patient of Thekkekandam, Debby PARAS, MD who had a Medicare Annual Wellness Visit today via telephone. Lynard is Retired and lives with their spouse. He does not have any children. He reports that he is socially active and does interact with friends/family regularly. He is moderately physically active and enjoys enjoys playing golf, hunt and working in the yard.

## 2023-12-04 ENCOUNTER — Encounter: Payer: Self-pay | Admitting: Sports Medicine

## 2023-12-27 ENCOUNTER — Other Ambulatory Visit: Payer: Self-pay

## 2023-12-27 DIAGNOSIS — N529 Male erectile dysfunction, unspecified: Secondary | ICD-10-CM

## 2023-12-27 MED ORDER — TADALAFIL 5 MG PO TABS: 5.0000 mg | ORAL_TABLET | Freq: Every day | ORAL | 0 refills | Status: AC

## 2023-12-28 ENCOUNTER — Telehealth: Payer: Self-pay

## 2023-12-28 ENCOUNTER — Other Ambulatory Visit (HOSPITAL_COMMUNITY): Payer: Self-pay

## 2023-12-28 NOTE — Telephone Encounter (Signed)
 Pharmacy Patient Advocate Encounter   Received notification from CoverMyMeds that prior authorization for Tadalafil  5MG  tablets  is required/requested.   Insurance verification completed.   The patient is insured through [employer] .   Per test claim: Per test claim, medication is not covered due to plan/benefit exclusion FOR ED, PA not submitted at this time   Tadalafil  may be covered for BPH, but patient must have had previous treatment, contraindication, or intolerance to: Alpha Blocker (i.e. terazosin, doxazosin, tamsulosin, alfuzosin, silodosin) or 5-alpha reductase inhibitor (i.e. finasteride, dutasteride)

## 2024-02-10 ENCOUNTER — Ambulatory Visit
Admission: EM | Admit: 2024-02-10 | Discharge: 2024-02-10 | Disposition: A | Discharge status: Home / Self Care | Attending: Family Medicine | Admitting: Family Medicine

## 2024-02-10 ENCOUNTER — Encounter: Payer: Self-pay | Admitting: Emergency Medicine

## 2024-02-10 DIAGNOSIS — T50905A Adverse effect of unspecified drugs, medicaments and biological substances, initial encounter: Secondary | ICD-10-CM | POA: Diagnosis not present

## 2024-02-10 DIAGNOSIS — K2900 Acute gastritis without bleeding: Secondary | ICD-10-CM | POA: Diagnosis not present

## 2024-02-10 MED ORDER — OMEPRAZOLE 40 MG PO CPDR: 40.0000 mg | DELAYED_RELEASE_CAPSULE | Freq: Every day | ORAL | 0 refills | Status: AC

## 2024-02-10 MED ORDER — LIDOCAINE VISCOUS HCL 2 % MT SOLN
15.0000 mL | Freq: Once | OROMUCOSAL | Status: AC
  Administered 2024-02-10: 15 mL via OROMUCOSAL

## 2024-02-10 MED ORDER — ALUM & MAG HYDROXIDE-SIMETH 200-200-20 MG/5ML PO SUSP
30.0000 mL | Freq: Once | ORAL | Status: AC
  Administered 2024-02-10: 30 mL via ORAL

## 2024-02-10 NOTE — ED Provider Notes (Signed)
 [name]    CSN: [id number] Arrival date & time: 02/10/24  1255      History   Chief Complaint Chief Complaint  Patient presents with   Abdominal Pain    HPI Adam Hatfield is a 73 y.o. male.   Mr. Anacker took clindamycin 3 times a day for 7 days for dental infection.  He states that he took his last dose 2 days ago.  Yesterday he felt okay, might have had a decreased appetite, today has abdominal pain and nausea with no vomiting.  He states he had a large bowel movement today which made the pain worse.  No diarrhea.  No fever or chills.  No headache or bodyaches.  Does not usually have problems with gastroesophageal reflux.  States he had an ulcer many years ago that was treated because of H. pylori.  No problems with the stomach since    Past Medical History:  Diagnosis Date   Glaucoma 2019   Hyperlipidemia     Patient Active Problem List   Diagnosis Date Noted   Influenza-like illness 05/17/2023   Macrocytosis without anemia 01/27/2022   Hematospermia 01/25/2022   Cervical spondylosis 03/23/2021   Systolic murmur 03/23/2021   Chalazion right upper eyelid 02/23/2021   Erectile dysfunction 02/20/2020   Lumbar degenerative disc disease 08/28/2019   Impingement syndrome of right shoulder 08/27/2018   Primary osteoarthritis of first carpometacarpal joint of one hand, left 08/21/2017   Tinnitus 08/21/2017   BPH (benign prostatic hyperplasia) 08/13/2017   CAD (coronary artery disease) 07/27/2014   History of smoking 07/01/2014   Chronic throat clearing 07/01/2014   Hyperlipidemia LDL goal <70 02/10/2012   Annual physical exam 02/05/2012    Past Surgical History:  Procedure Laterality Date   HEMORRHOID SURGERY  [id number]       Home Medications    Prior to Admission medications   Medication Sig Start Date End Date Taking? Authorizing Provider  aspirin  EC 81 MG tablet Take 81 mg by mouth daily. Swallow whole.   Yes [provider]   Omega-3 1000 MG CAPS Take 1 capsule by mouth daily.   Yes [provider]  omeprazole (PRILOSEC) 40 MG capsule Take 1 capsule (40 mg total) by mouth daily. 02/10/24  Yes Maranda Jamee Jacob, MD  psyllium (METAMUCIL) 58.6 % powder Take 1 packet by mouth as needed.   Yes [provider]  tadalafil  (CIALIS ) 5 MG tablet Take 1 tablet (5 mg total) by mouth daily. 12/27/23  Yes Willo Mini, NP  TRAVATAN Z 0.004 % SOLN ophthalmic solution  11/02/22  Yes [provider]    Family History Family History  Problem Relation Age of Onset   Heart disease Mother    Stroke Mother     Social History Social History   Tobacco Use   Smoking status: Former    Current packs/day: 0.00    Average packs/day: 0.5 packs/day for 38.3 years (19.2 ttl pk-yrs)    Types: Cigarettes    Start date: 12/07/1971    Quit date: 04/03/2010    Years since quitting: 13.8   Smokeless tobacco: Never  Vaping Use   Vaping status: Never Used  Substance Use Topics   Alcohol use: Yes    Alcohol/week: 4.0 standard drinks of alcohol    Types: 4 Glasses of wine per week   Drug use: No     Allergies   Amoxicillin and Biaxin [clarithromycin]   Review of Systems Review of Systems See HPI  Physical Exam Triage Vital Signs ED Triage Vitals  Encounter Vitals Group     BP 02/10/24 1338 (!) 153/90     Girls Systolic BP Percentile --      Girls Diastolic BP Percentile --      Boys Systolic BP Percentile --      Boys Diastolic BP Percentile --      Pulse Rate 02/10/24 1338 65     Resp 02/10/24 1338 18     Temp 02/10/24 1338 97.9 F (36.6 C)     Temp Source 02/10/24 1338 Oral     SpO2 02/10/24 1338 96 %     Weight 02/10/24 1338 190 lb (86.2 kg)     Height 02/10/24 1338 5' 11.5 (1.816 m)     Head Circumference --      Peak Flow --      Pain Score 02/10/24 1337 9     Pain Loc --      Pain Education --      Exclude from Growth Chart --    No data found.  Updated Vital Signs BP (!) 153/90  (BP Location: Right Arm)   Pulse 65   Temp 97.9 F (36.6 C) (Oral)   Resp 18   Ht 5' 11.5 (1.816 m)   Wt 86.2 kg   SpO2 96%   BMI 26.13 kg/m       Physical Exam Constitutional:      General: He is not in acute distress.    Appearance: He is well-developed and normal weight. He is not ill-appearing.  HENT:     Head: Normocephalic and atraumatic.  Eyes:     Conjunctiva/sclera: Conjunctivae normal.     Pupils: Pupils are equal, round, and reactive to light.  Cardiovascular:     Rate and Rhythm: Normal rate.  Pulmonary:     Effort: Pulmonary effort is normal. No respiratory distress.  Abdominal:     General: Bowel sounds are increased. There is no distension.     Palpations: Abdomen is soft. There is no hepatomegaly or splenomegaly.     Tenderness: There is abdominal tenderness in the epigastric area.  Musculoskeletal:        General: Normal range of motion.     Cervical back: Normal range of motion.  Skin:    General: Skin is warm and dry.  Neurological:     Mental Status: He is alert.      UC Treatments / Results  Labs (all labs ordered are listed, but only abnormal results are displayed) Labs Reviewed - No data to display  EKG   Radiology No results found.  Procedures ) Patient had acute epigastric tenderness on initial examination.  10 minutes after the GI cocktail palpation has returned to normal.  Still mild diffuse tenderness throughout abdomen, no focal findings Medications Ordered in UC Medications  alum & mag hydroxide-simeth (MAALOX/MYLANTA) 200-200-20 MG/5ML suspension 30 mL (30 mLs Oral Given 02/10/24 1413)  lidocaine (XYLOCAINE) 2 % viscous mouth solution 15 mL (15 mLs Mouth/Throat Given 02/10/24 1413)    Initial Impression / Assessment and Plan / UC Course  I have reviewed the triage vital signs and the nursing notes.  Pertinent labs & imaging results that were available during my care of the patient were reviewed by me and considered in my  medical decision making (see chart for details).    I think patient likely has a gastritis related to the clindamycin use.  He can use it in the future  if he takes it with food.  Follow-up with PCP Final Clinical Impressions(s) / UC Diagnoses   Final diagnoses:  Medication side effect, initial encounter  Acute gastritis, presence of bleeding unspecified, unspecified gastritis type     Discharge Instructions      Take Prilosec once a day until your stomach pain improves.  After that she may take it as needed for stomach acid problems See your primary care doctor in follow-up   ED Prescriptions     Medication Sig Dispense Auth. Provider   omeprazole (PRILOSEC) 40 MG capsule Take 1 capsule (40 mg total) by mouth daily. 14 capsule Maranda Jamee Jacob, MD      PDMP not reviewed this encounter.   Maranda Jamee Jacob, MD 02/10/24 [phone]

## 2024-02-10 NOTE — Discharge Instructions (Signed)
 Take Prilosec once a day until your stomach pain improves.  After that she may take it as needed for stomach acid problems See your primary care doctor in follow-up

## 2024-02-10 NOTE — ED Triage Notes (Signed)
 Patient states that he just finished a course of Clindamycin 300mg  for a dental infection on Thursday.  Today he began having abdominal pain and cramping.  Patient did have a BM today, some nausea no vomiting.

## 2024-11-18 ENCOUNTER — Ambulatory Visit
